# Patient Record
Sex: Male | Born: 1955 | Race: White | Hispanic: No | Marital: Married | Smoking: Former smoker
Health system: Southern US, Community
[De-identification: ages and names within clinical notes are randomized; demographics above are authoritative.]

## PROBLEM LIST (undated history)

## (undated) DIAGNOSIS — F419 Anxiety disorder, unspecified: Secondary | ICD-10-CM

## (undated) DIAGNOSIS — M722 Plantar fascial fibromatosis: Secondary | ICD-10-CM

## (undated) DIAGNOSIS — IMO0001 Reserved for inherently not codable concepts without codable children: Secondary | ICD-10-CM

## (undated) DIAGNOSIS — I2699 Other pulmonary embolism without acute cor pulmonale: Secondary | ICD-10-CM

## (undated) DIAGNOSIS — Z86718 Personal history of other venous thrombosis and embolism: Secondary | ICD-10-CM

## (undated) DIAGNOSIS — K219 Gastro-esophageal reflux disease without esophagitis: Secondary | ICD-10-CM

## (undated) DIAGNOSIS — I82409 Acute embolism and thrombosis of unspecified deep veins of unspecified lower extremity: Secondary | ICD-10-CM

## (undated) HISTORY — DX: Plantar fascial fibromatosis: M72.2

## (undated) HISTORY — DX: Acute embolism and thrombosis of unspecified deep veins of unspecified lower extremity: I82.409

## (undated) HISTORY — DX: Personal history of other venous thrombosis and embolism: Z86.718

## (undated) HISTORY — PX: EMBOLECTOMY: SHX44

## (undated) HISTORY — DX: Reserved for inherently not codable concepts without codable children: IMO0001

## (undated) HISTORY — DX: Gastro-esophageal reflux disease without esophagitis: K21.9

## (undated) HISTORY — DX: Anxiety disorder, unspecified: F41.9

## (undated) HISTORY — DX: Other pulmonary embolism without acute cor pulmonale: I26.99

---
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
## 2006-11-20 ENCOUNTER — Ambulatory Visit: Payer: Self-pay | Admitting: Unknown Physician Specialty

## 2009-05-18 ENCOUNTER — Ambulatory Visit: Payer: Self-pay | Admitting: Unknown Physician Specialty

## 2009-05-18 ENCOUNTER — Encounter: Payer: Self-pay | Admitting: Family Medicine

## 2009-11-13 ENCOUNTER — Ambulatory Visit: Payer: Self-pay | Admitting: Family Medicine

## 2013-07-27 ENCOUNTER — Ambulatory Visit (INDEPENDENT_AMBULATORY_CARE_PROVIDER_SITE_OTHER): Payer: Medicare HMO | Admitting: Podiatry

## 2013-07-27 ENCOUNTER — Other Ambulatory Visit: Payer: Self-pay | Admitting: *Deleted

## 2013-07-27 ENCOUNTER — Ambulatory Visit (INDEPENDENT_AMBULATORY_CARE_PROVIDER_SITE_OTHER): Payer: Medicare HMO

## 2013-07-27 ENCOUNTER — Encounter: Payer: Self-pay | Admitting: Podiatry

## 2013-07-27 VITALS — BP 113/81 | HR 70 | Resp 16 | Ht 73.0 in | Wt 180.0 lb

## 2013-07-27 DIAGNOSIS — Q6650 Congenital pes planus, unspecified foot: Secondary | ICD-10-CM

## 2013-07-27 DIAGNOSIS — M722 Plantar fascial fibromatosis: Secondary | ICD-10-CM

## 2013-07-27 MED ORDER — MELOXICAM 15 MG PO TABS
15.0000 mg | ORAL_TABLET | Freq: Every day | ORAL | Status: DC
Start: 2013-07-27 — End: 2013-12-14

## 2013-07-27 MED ORDER — METHYLPREDNISOLONE (PAK) 4 MG PO TABS: ORAL_TABLET | ORAL | Status: DC

## 2013-07-27 NOTE — Progress Notes (Signed)
   Subjective:    Patient ID: Kevin Carpenter, male    DOB: 30-Mar-1956, 58 y.o.   MRN: [id number]  HPI Comments: Been having pain in both feet. Left heel and right arch pain . Its been about a year , seen dr cline for the pain. He has not helped at all . He put me on mobic that didn't work switched to celebrex and that is not working . Have inserts in my shoes. Bought new shoes , put tape on my feet with some sort of sponge . Borrowed a night splint from one of dr hyatts patients. Hurts more after being on feet all day. Work on [employer] .      Review of Systems  HENT: Positive for hearing loss.        Ringing in ears  Sinus problems   Allergic/Immunologic: Positive for environmental allergies.  All other systems reviewed and are negative.      Objective:   Physical Exam: I have reviewed his past medical history medications allergies surgeries social history review of systems. Pulses remain palpable bilateral. Neurologic sensorium is intact per Semmes-Weinstein monofilament. Deep tendon reflexes are intact bilateral muscle strength is 5 over 5 dorsiflexors plantar flexors inverters everters all intrinsic musculature is intact. Orthopedic evaluation does demonstrates severe pain on palpation medial continued tubercle of the left heel. Mild tenderness on palpation along the medial longitudinal arch of the right foot. Radiographic evaluation of the bilateral foot demonstrates soft tissue increase in density at the plantar fascial calcaneal insertion site bilateral left greater than right.        Assessment & Plan:  Assessment: Is plantar fasciitis left foot greater than right.  Plan: Injected the left heel today wrote a prescription for Sterapred Dosepak to be followed by medic. He has a night splint at home and she's going utilize. We placed him in a plantar fascial brace. Discussed appropriate shoe gear stretching exercises and ice therapy. May need orthotics next visit.

## 2013-08-17 ENCOUNTER — Ambulatory Visit (INDEPENDENT_AMBULATORY_CARE_PROVIDER_SITE_OTHER): Payer: Medicare HMO | Admitting: Podiatry

## 2013-08-17 ENCOUNTER — Encounter: Payer: Self-pay | Admitting: Podiatry

## 2013-08-17 VITALS — BP 110/80 | HR 70 | Resp 16

## 2013-08-17 DIAGNOSIS — M722 Plantar fascial fibromatosis: Secondary | ICD-10-CM

## 2013-08-17 NOTE — Progress Notes (Signed)
He presents today for followup of his plantar fasciitis. He states that his right foot is approximately 95% better more than likely secondary to a pair shoes that he has recently purchased. He states that the left foot is minimally better approximately 15% but he depends on the plantar fascial brace the new shoes anti-inflammatories and a night splint. He states that he just wants this gone taking care of.  Objective: Vital signs are stable he is alert and oriented x3. Pulses are strongly palpable bilateral. Minimal pain on palpation medial continued tubercle of the right heel. Left heel still has pain on palpation and is still warm to touch along the medial band of the plantar fascia at its insertion site. The distal plantar fasciitis has resolved and the medial longitudinal arch plantar fasciitis has resolved. Strangely enough is all located at the insertional site at this point.  Assessment resolving plantar fasciitis right and left to a lesser degree.  Plan: Reinjected his left heel today with Kenalog and local anesthetic she will start back on his meloxicam and he will continue all other conservative therapies I will followup with him in approximately one month at which time we may need to consider orthotics prior to surgical intervention if necessary

## 2013-09-13 ENCOUNTER — Telehealth: Payer: Self-pay | Admitting: *Deleted

## 2013-09-13 NOTE — Telephone Encounter (Signed)
I have no Idea.

## 2013-09-13 NOTE — Telephone Encounter (Signed)
He;s going to have to start wearing steel toe shoes.  He wants a recommendation.  He needs them before his next visit on 09/28/2013.  Recommendation on what brand to purchase.

## 2013-09-14 NOTE — Telephone Encounter (Signed)
I called and informed him Dr. Milinda Pointer said he has no idea about the name of a good steel toe shoe.  I told him we have some patients that like [employer].  He stated he has worn those before and they are good shoes.  He said however his company is buying the shoes and they use a different company.  He thanked me for calling.

## 2013-09-22 ENCOUNTER — Encounter: Payer: Self-pay | Admitting: Podiatry

## 2013-09-22 ENCOUNTER — Ambulatory Visit (INDEPENDENT_AMBULATORY_CARE_PROVIDER_SITE_OTHER): Payer: Medicare HMO | Admitting: Podiatry

## 2013-09-22 DIAGNOSIS — M722 Plantar fascial fibromatosis: Secondary | ICD-10-CM

## 2013-09-22 NOTE — Progress Notes (Signed)
He presents today after purchasing a new pair of new balance tennis shoes stating that his bilateral heels and feet are feeling much better. He states that his left heel is still the worst it is most painful. His right heel however is more painful laterally.  Objective: Pulses are palpable bilateral pain on palpation medial continued tubercle left heel lateral calcaneal tubercle right heel.  Assessment: Plantar fasciitis and lateral compensatory syndrome bilateral.  Plan: Injected with Kenalog Depo-Medrol dexamethasone and local anesthetic to the bilateral heels today. He will continue all conservative therapies followup with him in 6 weeks

## 2013-10-05 ENCOUNTER — Ambulatory Visit: Payer: Medicare HMO | Admitting: Podiatry

## 2013-11-03 ENCOUNTER — Encounter: Payer: Self-pay | Admitting: Podiatry

## 2013-11-03 ENCOUNTER — Ambulatory Visit (INDEPENDENT_AMBULATORY_CARE_PROVIDER_SITE_OTHER): Payer: No Typology Code available for payment source | Admitting: Podiatry

## 2013-11-03 VITALS — BP 120/80 | HR 88 | Resp 16

## 2013-11-03 DIAGNOSIS — M722 Plantar fascial fibromatosis: Secondary | ICD-10-CM

## 2013-11-03 NOTE — Progress Notes (Signed)
He presents today for followup of his plantar fasciitis. He states it is approximately 60% improved. He continues to improve daily he states.  Objective: Vital signs are stable he is alert and oriented x3. Minimal pain on palpation medial calcaneal tubercles bilateral.  Assessment: Plantar fasciitis bilateral slowly resolving.  Plan: Continue all conservative therapies. Followup with him in September for another set of injections.

## 2013-12-01 ENCOUNTER — Encounter: Payer: Self-pay | Admitting: Podiatry

## 2013-12-01 ENCOUNTER — Ambulatory Visit (INDEPENDENT_AMBULATORY_CARE_PROVIDER_SITE_OTHER): Payer: No Typology Code available for payment source | Admitting: Podiatry

## 2013-12-01 VITALS — BP 135/84 | HR 70 | Resp 18 | Wt 183.0 lb

## 2013-12-01 DIAGNOSIS — M7752 Other enthesopathy of left foot: Secondary | ICD-10-CM

## 2013-12-01 DIAGNOSIS — M722 Plantar fascial fibromatosis: Secondary | ICD-10-CM

## 2013-12-01 DIAGNOSIS — M7750 Other enthesopathy of unspecified foot: Secondary | ICD-10-CM

## 2013-12-01 NOTE — Progress Notes (Signed)
He presents today stating the he is some better than he was previously. He still has pain to the bilateral heels and to the lateral aspect of the left foot. He states that he noticed it he is compensating which is causing his pain to the left foot.  Objective: Vital signs are stable he is alert and oriented x3 he still has pain on palpation medial continued tubercles bilateral. Overlying soft tissue mass such as a bursa fifth metatarsal base of the left foot no need for radiographs do demonstrate any type of osseous abnormalities in this area.  Assessment: Bursitis capsulitis fifth metatarsal base left foot plantar fasciitis bilateral.  Plan: Discussed etiology pathology conservative versus surgical therapies. Injected the bilateral heels today and the fifth metatarsal base of the left foot. He was also scan for some orthotics. He will continue all other conservative therapies.

## 2013-12-14 ENCOUNTER — Other Ambulatory Visit: Payer: Self-pay | Admitting: Podiatry

## 2013-12-15 ENCOUNTER — Other Ambulatory Visit: Payer: Self-pay | Admitting: Podiatry

## 2013-12-19 ENCOUNTER — Encounter: Payer: Self-pay | Admitting: *Deleted

## 2014-01-09 ENCOUNTER — Ambulatory Visit (INDEPENDENT_AMBULATORY_CARE_PROVIDER_SITE_OTHER): Payer: No Typology Code available for payment source | Admitting: Podiatry

## 2014-01-09 VITALS — BP 106/74 | HR 79 | Resp 16

## 2014-01-09 DIAGNOSIS — M722 Plantar fascial fibromatosis: Secondary | ICD-10-CM

## 2014-01-09 NOTE — Patient Instructions (Signed)

## 2014-01-09 NOTE — Progress Notes (Signed)
He presents today to pick up his orthotics. He was me to take a look at his toes third digit of the right foot where he pulled a piece of skin off and it was bleeding.  Objective: Vital signs are stable he is alert and oriented x3 superficial paronychia secondary to removing a small piece of skin tibial border third digit of the right foot. Orthotics appear to be fitting well.  Assessment: Paronychia third digit right foot. Plantar fasciitis resolving.  Plan: Soak the right foot in Epsom salts and water. Should the ingrown nail or the paronychia not resolve notify me immediately. Otherwise he'll followup with him in one month at which time he may need another injection

## 2014-01-27 ENCOUNTER — Ambulatory Visit: Payer: Self-pay | Admitting: Otolaryngology

## 2014-02-01 ENCOUNTER — Ambulatory Visit: Payer: Self-pay | Admitting: Internal Medicine

## 2014-02-01 DIAGNOSIS — R079 Chest pain, unspecified: Secondary | ICD-10-CM | POA: Insufficient documentation

## 2014-02-01 DIAGNOSIS — I82409 Acute embolism and thrombosis of unspecified deep veins of unspecified lower extremity: Secondary | ICD-10-CM | POA: Insufficient documentation

## 2014-02-01 DIAGNOSIS — R0602 Shortness of breath: Secondary | ICD-10-CM | POA: Insufficient documentation

## 2014-02-01 DIAGNOSIS — F419 Anxiety disorder, unspecified: Secondary | ICD-10-CM | POA: Insufficient documentation

## 2014-02-06 ENCOUNTER — Institutional Professional Consult (permissible substitution): Payer: Self-pay | Admitting: Internal Medicine

## 2014-02-06 ENCOUNTER — Ambulatory Visit: Payer: Self-pay | Admitting: Vascular Surgery

## 2014-02-06 ENCOUNTER — Ambulatory Visit: Payer: No Typology Code available for payment source | Admitting: Podiatry

## 2014-02-07 ENCOUNTER — Ambulatory Visit: Payer: Self-pay | Admitting: Vascular Surgery

## 2014-02-27 ENCOUNTER — Ambulatory Visit: Payer: No Typology Code available for payment source | Admitting: Podiatry

## 2014-02-28 DIAGNOSIS — I719 Aortic aneurysm of unspecified site, without rupture: Secondary | ICD-10-CM | POA: Insufficient documentation

## 2014-02-28 DIAGNOSIS — Q2543 Congenital aneurysm of aorta: Secondary | ICD-10-CM | POA: Insufficient documentation

## 2014-02-28 DIAGNOSIS — I7121 Aneurysm of the ascending aorta, without rupture: Secondary | ICD-10-CM | POA: Insufficient documentation

## 2014-04-17 ENCOUNTER — Ambulatory Visit (INDEPENDENT_AMBULATORY_CARE_PROVIDER_SITE_OTHER): Payer: No Typology Code available for payment source | Admitting: Podiatry

## 2014-04-17 ENCOUNTER — Encounter: Payer: Self-pay | Admitting: *Deleted

## 2014-04-17 DIAGNOSIS — M722 Plantar fascial fibromatosis: Secondary | ICD-10-CM

## 2014-04-17 NOTE — Progress Notes (Signed)
Called [facility] spoke with alex. Told him pt is requesting 2nd pair of orthotics same as first pair. Alex understood and will start the order.

## 2014-04-17 NOTE — Progress Notes (Signed)
He presents today for follow-up visit of his plantar fasciitis. He said that both heels are hurting.  Objective: Vital signs are stable he is alert and oriented 3. Pain on palpation medial calcaneal tubercle bilateral. Pulses are palpable and calf pain is not present.  Assessment: Pain in limb secondary to plantar fasciitis bilateral  Plan: Injected bilateral heels today continue all conservative therapies follow up with me in 1 month if necessary

## 2014-05-15 ENCOUNTER — Ambulatory Visit: Payer: No Typology Code available for payment source | Admitting: Podiatry

## 2014-05-16 ENCOUNTER — Encounter: Payer: Self-pay | Admitting: Podiatry

## 2014-05-29 ENCOUNTER — Ambulatory Visit (INDEPENDENT_AMBULATORY_CARE_PROVIDER_SITE_OTHER): Payer: No Typology Code available for payment source | Admitting: Podiatry

## 2014-05-29 ENCOUNTER — Encounter: Payer: Self-pay | Admitting: Podiatry

## 2014-05-29 DIAGNOSIS — G5790 Unspecified mononeuropathy of unspecified lower limb: Secondary | ICD-10-CM

## 2014-05-29 DIAGNOSIS — M722 Plantar fascial fibromatosis: Secondary | ICD-10-CM | POA: Diagnosis not present

## 2014-05-29 NOTE — Progress Notes (Signed)
He presents today to follow-up for his plantar for fasciitis. He states that is really not a lot better.  Objective: Vital signs are stable he is alert and oriented 3. Pain on palpation medial calcaneal tubercles bilateral.  Assessment: Secondary to the fact that we have been treating this approximate 10 months at this point I think we should readdress our strategy and consider a neuritis.  Plan: We will send back his orthotics and have them recovered as they are falling apart him September of this year. I also think we should start dehydrated alcohol injections bilaterally. He received his first dehydrated alcohol injection today and will receive his second in 3 weeks

## 2014-06-21 ENCOUNTER — Encounter: Payer: Self-pay | Admitting: Podiatry

## 2014-06-21 ENCOUNTER — Ambulatory Visit (INDEPENDENT_AMBULATORY_CARE_PROVIDER_SITE_OTHER): Payer: No Typology Code available for payment source | Admitting: Podiatry

## 2014-06-21 DIAGNOSIS — G5790 Unspecified mononeuropathy of unspecified lower limb: Secondary | ICD-10-CM

## 2014-06-21 DIAGNOSIS — M722 Plantar fascial fibromatosis: Secondary | ICD-10-CM | POA: Diagnosis not present

## 2014-06-21 NOTE — Patient Instructions (Signed)

## 2014-06-22 NOTE — Progress Notes (Signed)
He presents today to pick up his orthotics and he states that his heel seems to be worse now than they have been before.  Objective: Vital signs are stable he is alert and oriented 3. Pulses are palpable. He has pain on palpation medially calcaneal tubercles bilateral heel I reinjected his heels today with dehydrated alcohol. This is his second dose of dehydrated alcohol.  Assessment: Neuritis fasciitis bilateral.  Plan: Reinjected his second dose of dehydrated alcohol today to each heel. He requested narcotics and I declined. He will try his newest orthotics and I will follow-up with him in 3 weeks.

## 2014-07-12 ENCOUNTER — Ambulatory Visit (INDEPENDENT_AMBULATORY_CARE_PROVIDER_SITE_OTHER): Payer: No Typology Code available for payment source | Admitting: Podiatry

## 2014-07-12 VITALS — BP 114/75 | HR 74 | Resp 16

## 2014-07-12 DIAGNOSIS — G5790 Unspecified mononeuropathy of unspecified lower limb: Secondary | ICD-10-CM | POA: Diagnosis not present

## 2014-07-12 DIAGNOSIS — M722 Plantar fascial fibromatosis: Secondary | ICD-10-CM

## 2014-07-12 NOTE — Progress Notes (Signed)
He presents today for follow-up of his plantar fasciitis neuritis of the bilateral heels. He states they may be a little bit better.  Objective: Vital signs are stable he is alert and oriented 3. Pulses are strongly palpable bilateral. He still has pain on palpation medial calcaneal tubercles bilateral. No Pain.  Assessment: Plantar fasciitis/Baxter's neuritis bilateral.  Plan: Discussed etiology pathology conservative versus surgical therapies we injected his third dose of dehydrated alcohol today I will follow-up with him in 3 weeks

## 2014-08-02 ENCOUNTER — Ambulatory Visit (INDEPENDENT_AMBULATORY_CARE_PROVIDER_SITE_OTHER): Payer: No Typology Code available for payment source | Admitting: Podiatry

## 2014-08-02 ENCOUNTER — Encounter: Payer: Self-pay | Admitting: Podiatry

## 2014-08-02 VITALS — Ht 73.0 in | Wt 185.0 lb

## 2014-08-02 DIAGNOSIS — G5790 Unspecified mononeuropathy of unspecified lower limb: Secondary | ICD-10-CM

## 2014-08-02 NOTE — Progress Notes (Signed)
He presents today for follow-up of his neuritis bilateral foot. He states that he Is having better days then previously noted. A few were bad days. He states that he still has pain but it seems to be a less degree. His left foot feels much better than the right.  Objective: Vital signs are stable he is alert and oriented 3 still has tenderness on palpation medial calcaneal tubercle bilateral right greater than left. Pulses remain palpable pain.  Assessment: Neuritis plantar fasciitis bilateral.  Plan: Injected the bilateral heels with dehydrated alcohol once again today alleviating his symptomatology immediately. I will follow-up with him in 3 weeks.

## 2014-08-23 ENCOUNTER — Ambulatory Visit (INDEPENDENT_AMBULATORY_CARE_PROVIDER_SITE_OTHER): Payer: No Typology Code available for payment source | Admitting: Podiatry

## 2014-08-23 ENCOUNTER — Encounter: Payer: Self-pay | Admitting: Podiatry

## 2014-08-23 VITALS — BP 100/67 | HR 72 | Resp 16

## 2014-08-23 DIAGNOSIS — G5790 Unspecified mononeuropathy of unspecified lower limb: Secondary | ICD-10-CM

## 2014-08-23 DIAGNOSIS — M722 Plantar fascial fibromatosis: Secondary | ICD-10-CM

## 2014-08-23 NOTE — Progress Notes (Signed)
Kevin Carpenter presents today for follow-up of his bilateral heel pain he states that it is been bothering him worse over the past 3 weeks that it has an long time. He's ready to have something done to these as necessary. He also has a history of lower back pain sees a chiropractor on a regular basis.  Objective: Vital signs are stable he is alert and oriented 3. Pulses are palpable bilateral. Severe pain on palpation medial calcaneal tubercles bilateral.  Assessment: Chronic intractable plantar fasciitis/neuritis. Do not rule out a radiculopathy bilateral.  Plan: Chemical destruction actors nerve bilateral heels this was his seventh dehydrated alcohol injection. At this point due to the chronicity of the pain and if intractable state requesting that he see Dr. Mohammed Kindle for evaluation of his spine. I would like to make sure that he has no spinal problems that would result in pain to his bilateral foot. Should Dr. Primus Bravo findings no calls of distal symptoms from the spine and an MRI of the bilateral foot will be necessary to confirm chronic intractable plantar fasciitis bilateral. I reinjected his heels today and will follow-up with him in 3 weeks.

## 2014-08-29 NOTE — Addendum Note (Signed)
Addended by: Rip Harbour on: 08/29/2014 08:42 AM   Modules accepted: Orders, Medications

## 2014-09-06 ENCOUNTER — Telehealth: Payer: Self-pay | Admitting: Podiatry

## 2014-09-07 NOTE — Telephone Encounter (Signed)
Made in error

## 2014-09-12 ENCOUNTER — Telehealth: Payer: Self-pay | Admitting: *Deleted

## 2014-09-12 ENCOUNTER — Telehealth: Payer: Self-pay | Admitting: Pain Medicine

## 2014-09-12 NOTE — Telephone Encounter (Signed)
Called pt to sch apt with Dr Primus Bravo, left pt vm, pt papers in crisp vm folder

## 2014-09-12 NOTE — Telephone Encounter (Signed)
Spoke with Caryl Pina at dr crisp office regarding referral from dr Milinda Pointer for Kevin Carpenter, she stated that she had just got the approval from dr crisp to see the patient , she is calling the patient today to schedule the appointment

## 2014-09-13 ENCOUNTER — Ambulatory Visit: Payer: No Typology Code available for payment source | Admitting: Podiatry

## 2014-09-18 ENCOUNTER — Ambulatory Visit: Payer: No Typology Code available for payment source | Admitting: Podiatry

## 2014-09-25 ENCOUNTER — Ambulatory Visit (INDEPENDENT_AMBULATORY_CARE_PROVIDER_SITE_OTHER): Payer: No Typology Code available for payment source | Admitting: Podiatry

## 2014-09-25 DIAGNOSIS — G5790 Unspecified mononeuropathy of unspecified lower limb: Secondary | ICD-10-CM

## 2014-09-25 DIAGNOSIS — M722 Plantar fascial fibromatosis: Secondary | ICD-10-CM | POA: Diagnosis not present

## 2014-09-25 NOTE — Progress Notes (Signed)
He presents today for follow-up of his injections of the plantar heel bilaterally. He states I see no improvement. He states that he goes tomorrow to see Dr. Josefa Half for the pain in his feet. He states that his chiropractor has adjusted several times and alleviated some of the tenderness in his feet with ultrasound. Otherwise the only time he has gotten any better is when he took a sterilely dose pack.  Objective: Pulses are palpable bilateral pain. Has pain on palpation medial calcaneal tubercle bilateral.  Assessment: Chronic intractable plantar fasciitis neuritis can't rule out radiculopathy and chronic pain bilateral.  Plan: Should his back not be the issue for his chronic foot pain I would like to consider MRI with contrast for an attempt to rule out tarsal tunnel syndrome and plantar fasciitis.

## 2014-09-26 ENCOUNTER — Encounter: Payer: Self-pay | Admitting: Pain Medicine

## 2014-09-26 ENCOUNTER — Ambulatory Visit: Payer: No Typology Code available for payment source | Attending: Pain Medicine | Admitting: Pain Medicine

## 2014-09-26 VITALS — BP 115/74 | HR 64 | Temp 97.8°F | Resp 16 | Ht 73.0 in | Wt 185.0 lb

## 2014-09-26 DIAGNOSIS — M79672 Pain in left foot: Secondary | ICD-10-CM | POA: Diagnosis present

## 2014-09-26 DIAGNOSIS — G90523 Complex regional pain syndrome I of lower limb, bilateral: Secondary | ICD-10-CM | POA: Diagnosis not present

## 2014-09-26 DIAGNOSIS — M79671 Pain in right foot: Secondary | ICD-10-CM | POA: Diagnosis present

## 2014-09-26 DIAGNOSIS — M5136 Other intervertebral disc degeneration, lumbar region: Secondary | ICD-10-CM | POA: Diagnosis not present

## 2014-09-26 DIAGNOSIS — M722 Plantar fascial fibromatosis: Secondary | ICD-10-CM | POA: Insufficient documentation

## 2014-09-26 MED ORDER — GABAPENTIN 300 MG PO CAPS: ORAL_CAPSULE | ORAL | Status: DC

## 2014-09-26 NOTE — Progress Notes (Signed)
   Subjective:    Patient ID: Kevin Carpenter, male    DOB: Sep 25, 1955, 59 y.o.   MRN: [id number]  HPI  Patient is 59 year old gentleman who comes to pain management Center at the request of Dr. Sherren Mocha height for further evaluation and treatment of pain involving the feet. Patient is with burning sensation of the feet which is constant agonizing and more intense End of the day patient states that the pain increases with standing the pain decreased with lying down and medications. The patient has diagnosis of plantar fasciitis of both feet. Patient's pain has persisted despite prior treatment. We discussed patient's condition on today's visit and will consider modification of medications at this time. There is concern regarding component of pain being due to complex regional pain syndrome of the lower extremities. Patient will continue his his anticoagulant therapy and we will avoid interventional treatment at this time. We will begin Neurontin and pending response to Neurontin May consider additional modification of treatment regimen we have also discussed beginning Cymbalta which we may consider as well as of the medications. The patient was understanding and in agreement with suggested treatment plan    Review of Systems  Cardiovascular Anticoagulant therapy  Pulmonary Unremarkable  Neurological Unremarkable  Psychological Unremarkable  Gastrointestinal Unremarkable  Genitourinary Unremarkable  Hematological Anticoagulant therapy  Endocrine Unremarkable  Rheumatological Unremarkable  Musculoskeletal Unremarkable  Other significant histoy Unremarkable         Objective:   Physical Exam  There was tennis over the splenius capitis occipitalis regions palpation of which reproduced minimal discomfort. There appeared to be unremarkable Spurling's maneuver and patient appeared to be with bilaterally equal grip strength. Tinel's and Phalen's maneuver were without increase of  pain of any significant degree. There was no crepitus of the thoracic region noted. Palpation over the lumbar paraspinal musculature region lumbar facet region was with minimal discomfort. Lateral bending and rotation and extension and palpation of the lumbar facet region reproduced minimal discomfort. There was minimal discomfort of the PSIS and PII S regions. No definite sensory deficit of dermatomal distribution of the lower extremities was detected. There was tenderness to palpation of the feet in the region of the calcaneus with no evidence of new lesions of the feet. There was some increased sensitivity to touch in the region of the heels with questionable decreased temperature of the lower extremities. There was minimal tenderness of the greater trochanteric region iliotibial band region. Abdomen was nontender and no costovertebral angle tenderness was noted.      Assessment & Plan:   Complex regional pain syndrome of the lower extremities  Plantar fasciitis, bilateral  Degenerative disc disease of the lumbar spine There is concern regarding intraspinal abnormalities contributing to patient's symptomatology we will consider further is pending response to the present treatment    Plan   Continue present medications. As discussed with the patient, we began Neurontin today  We will avoid interventional treatment. At this time patient will continue anticoagulant therapy  F/U PCP for evaliation of  BP and general medical  condition.  F/U surgical evaluation.  F/U neurological evaluation.  May consider radiofrequency rhizolysis or intraspinal procedures pending response to present treatment and F/U evaluation.  Patient to call Pain Management Center should patient have concerns prior to scheduled return appointment.

## 2014-09-26 NOTE — Progress Notes (Signed)
Safety precautions to be maintained throughout the outpatient stay will include: orient to surroundings, keep bed in low position, maintain call bell within reach at all times, provide assistance with transfer out of bed and ambulation.  

## 2014-09-26 NOTE — Patient Instructions (Addendum)
Continue present medications and begin Neurontin as prescribed. We may consider Cymbalta as well as discussed  F/U PCP for evaliation of  BP and general medical  condition.  F/U surgical evaluation follow-up Dr. Milinda Pointer as discussed  F/U neurological evaluation.  May consider radiofrequency rhizolysis or intraspinal procedures pending response to present treatment and F/U evaluation.  Patient to call Pain Management Center should patient have concerns prior to scheduled return appointment.

## 2014-09-27 ENCOUNTER — Telehealth: Payer: Self-pay | Admitting: Podiatry

## 2014-09-27 NOTE — Telephone Encounter (Signed)
Spoke with patient's wife regarding treatment at the pain center. I did advise that he should continue treatment at the pain center seeing that he has only had one initial evaluation. I informed the wife that he only needs to see Dr. Milinda Pointer if he wants to pursue surgical options for the plantar fasciitis. Otherwise, he is to continue with Dr. Ethel Rana treatment plan.

## 2014-09-27 NOTE — Telephone Encounter (Signed)
Patient's wife called to schedule an appointment since husband was since yesterday at the pain clinic. He is scheduled to come in at 3:45pm on Wednesday July 27th. The wife has questions and concerns since the appointment with the pain clinic yesterday. She is asking to be called to see what the next step is.

## 2014-10-06 ENCOUNTER — Other Ambulatory Visit: Payer: Self-pay | Admitting: Pain Medicine

## 2014-10-18 ENCOUNTER — Other Ambulatory Visit: Payer: Self-pay | Admitting: Pain Medicine

## 2014-10-18 DIAGNOSIS — C4491 Basal cell carcinoma of skin, unspecified: Secondary | ICD-10-CM

## 2014-10-18 HISTORY — DX: Basal cell carcinoma of skin, unspecified: C44.91

## 2014-10-23 ENCOUNTER — Encounter: Payer: Self-pay | Admitting: Pain Medicine

## 2014-10-23 ENCOUNTER — Ambulatory Visit: Payer: No Typology Code available for payment source | Attending: Pain Medicine | Admitting: Pain Medicine

## 2014-10-23 VITALS — BP 124/75 | HR 50 | Temp 97.8°F | Resp 16 | Ht 73.0 in | Wt 185.0 lb

## 2014-10-23 DIAGNOSIS — G90523 Complex regional pain syndrome I of lower limb, bilateral: Secondary | ICD-10-CM

## 2014-10-23 DIAGNOSIS — G90529 Complex regional pain syndrome I of unspecified lower limb: Secondary | ICD-10-CM | POA: Diagnosis not present

## 2014-10-23 DIAGNOSIS — M79605 Pain in left leg: Secondary | ICD-10-CM | POA: Diagnosis present

## 2014-10-23 DIAGNOSIS — M722 Plantar fascial fibromatosis: Secondary | ICD-10-CM | POA: Diagnosis not present

## 2014-10-23 DIAGNOSIS — G588 Other specified mononeuropathies: Secondary | ICD-10-CM | POA: Insufficient documentation

## 2014-10-23 DIAGNOSIS — M79604 Pain in right leg: Secondary | ICD-10-CM | POA: Diagnosis present

## 2014-10-23 MED ORDER — GABAPENTIN 300 MG PO CAPS: ORAL_CAPSULE | ORAL | Status: DC

## 2014-10-23 NOTE — Patient Instructions (Addendum)
Continue present medication Neurontin and take 1 capsule at bedtime and one in the morning and 1 during the day. May increase to 2 capsules at bedtime then 1 in the morning and 1 during the day after one week if tolerated  F/U PCP Dr Margit Banda  for evaliation of  BP and general medical  condition.  F/U surgical evaluation Dr Milinda Pointer and discussed your work shoes including inserts which Dr.Hyatt may recommend   F/U neurological evaluation  May consider radiofrequency rhizolysis or intraspinal procedures pending response to present treatment and F/U evaluation.  Patient to call Pain Management Center should patient have concerns prior to scheduled return appointment.

## 2014-10-23 NOTE — Progress Notes (Signed)
   Subjective:    Patient ID: Kevin Carpenter, male    DOB: 05/16/1955, 59 y.o.   MRN: [id number]  HPI Patient is 59 year old gentleman returns a Pain Management Center for further evaluation and treatment of pain involving the lower extremity regions. Patient is with diagnosis of plantar fasciitis and is with severe pain involving the feet with burning stinging throbbing and aching sensation. Patient notes some improvement of his pain with Neurontin which we prescribed at time of patient's previous visit Pain Management Center. We will also discussed medications Cymbalta which we will avoid prescribing at this time and we will increase Neurontin dose instead we will continue to avoid interventional treatment due to patient's anticoagulant therapy. The patient was understanding and in agreement status treatment plan. We'll also recommended patient follow-up with Dr. Milinda Pointer to discuss adjustment of orthotics for patient's work shoes and for other issues. The patient was understanding and in agreement with suggested treatment plan     Review of Systems     Objective:   Physical Exam There was mild tenderness of the splenius capitis and occipitalis musculature region. There was mild tenderness of the cervical facet cervical paraspinal musculature region mild tenderness of the thoracic facet thoracic paraspinal musculature region. The patient was with tenderness to palpation of the acromioclavicular glenohumeral joint region a minimal degree. Patient appeared to be with bilaterally equal grip strength. Tinel and Phalen's maneuver were without increase of pain significant degree. There was no crepitus of the thoracic region noted. Palpation over the lumbar paraspinal muscles region lumbar facet region associated with mild discomfort. Lateral bending and rotation and extension and palpation of the lumbar facets reproduce mild discomfort. Mild tenderness of the greater trochanteric region iliotibial band region.  Straight leg raising was tolerates approximately 30 without an increase of pain with dorsiflexion noted. EHL strength appeared to be decreased. No definite sensory deficit of dermatomal distribution detected. The feet were with tenderness to palpation plantar aspect of the feet and in the region of the calcaneus. No new lesions of the feet were noted. There was increased tenderness to palpation of the feet without definite allodynia noted. Abdomen nontender with no costovertebral angle tenderness noted.       Assessment & Plan:      Plantar fasciitis  Complex regional pain syndrome of the lower extremities  Neuralgia of feet   Plan   Continue present medication Neurontin. Neurontin dose was increased to one Neurontin 3 times per day for one week then increase to 2 Neurontin at bedtime and one in the morning and 1 mid day if tolerated  F/U PCP Dr.Chabon   for evaliation of  BP and general medical  condition.  F/U surgical evaluation with Dr.Hyatt to discuss orthotics for work boots and other issues   F/U neurological evaluation  May consider radiofrequency rhizolysis or intraspinal procedures pending response to present treatment and F/U evaluation.  Patient to call Pain Management Center should patient have concerns prior to scheduled return appointment.

## 2014-10-23 NOTE — Progress Notes (Signed)
Safety precautions to be maintained throughout the outpatient stay will include: orient to surroundings, keep bed in low position, maintain call bell within reach at all times, provide assistance with transfer out of bed and ambulation.  

## 2014-10-25 ENCOUNTER — Ambulatory Visit: Payer: No Typology Code available for payment source | Admitting: Podiatry

## 2014-10-30 DIAGNOSIS — I2699 Other pulmonary embolism without acute cor pulmonale: Secondary | ICD-10-CM | POA: Insufficient documentation

## 2014-11-14 ENCOUNTER — Other Ambulatory Visit: Payer: Self-pay | Admitting: Pain Medicine

## 2014-11-23 ENCOUNTER — Encounter: Payer: Self-pay | Admitting: Pain Medicine

## 2014-11-23 ENCOUNTER — Ambulatory Visit: Payer: No Typology Code available for payment source | Attending: Pain Medicine | Admitting: Pain Medicine

## 2014-11-23 VITALS — BP 107/80 | HR 61 | Temp 97.7°F | Resp 18 | Ht 73.0 in | Wt 185.0 lb

## 2014-11-23 DIAGNOSIS — G90523 Complex regional pain syndrome I of lower limb, bilateral: Secondary | ICD-10-CM

## 2014-11-23 DIAGNOSIS — M79605 Pain in left leg: Secondary | ICD-10-CM | POA: Diagnosis present

## 2014-11-23 DIAGNOSIS — G588 Other specified mononeuropathies: Secondary | ICD-10-CM | POA: Diagnosis not present

## 2014-11-23 DIAGNOSIS — M722 Plantar fascial fibromatosis: Secondary | ICD-10-CM

## 2014-11-23 DIAGNOSIS — M79604 Pain in right leg: Secondary | ICD-10-CM | POA: Diagnosis present

## 2014-11-23 MED ORDER — GABAPENTIN 300 MG PO CAPS: ORAL_CAPSULE | ORAL | Status: DC

## 2014-11-23 NOTE — Progress Notes (Signed)
Discharged to home ambulatory with script in hand for gabapentin.  Return in 1 month.

## 2014-11-23 NOTE — Progress Notes (Signed)
   Subjective:    Patient ID: Kevin Carpenter, male    DOB: [birth year], 59 y.o.   MRN: [id number]  HPI  Patient is 59 year old gentleman [facility] for further evaluation and treatment of pain involving the lower extremity regions. Patient states he is doing much better at this time. We adjusted patient's Neurontin and patient appears to be improving in terms of the pain involving his feet. We also discussed patient shoes and patient will dress this with Dr. Milinda Pointer and will consider undergoing evaluation of his feet at shoe store as we discussed. Patient without any trauma or other changes in condition. We discussed interventional treatment which we will avoid at this time. We addressed the need for patient to wear proper shoes to avoid aggravation of his condition. The patient was understanding and agreed suggested treatment plan.    Review of Systems     Objective:   Physical Exam  There was minimal tenderness of the splenius capitis and occipitalis musculature region. Patient appeared to be with bilaterally equal grip strength. Tinel and Phalen's maneuver without increased pain of any significant degree. There was minimal tenderness of the splenius capitis and occipitalis musculature regions. There was unremarkable Spurling's maneuver. No tenderness of the acromioclavicular glenohumeral joint region was noted. There was no crepitus of the thoracic region noted. Palpation over the lumbar paraspinal musculature region lumbar facet region was a tends to palpation of mild degree. With mild tenderness of the PSIS and PII S regions as well as the gluteal and piriformis musculature regions. There was no significant tends to palpation of the greater trochanteric region iliotibial band region. Straight leg raising was tolerates approximately 30 without increased pain with dorsiflexion noted. There was negative clonus negative Homans. There was no tends to palpation of the PSIS and PII S  region of the gluteal and piriformis musculature region. There was minimal tends to palpation greater trochanteric region and iliotibial band region. There was negative clonus negative Homans. Abdomen was soft nontender and no costovertebral angle tenderness was noted.    Assessment & Plan:    Plantar fasciitis  Complex regional pain syndrome of the lower extremities  Neuralgia of feet    Plan   Continue present medication Neurontin as discussed  F/U PCP Dr.Chabon   for evaliation of  BP and general medical  condition  F/U surgical evaluationArea patient will follow-up with Dr. Milinda Pointer as discussed  F/U neurological evaluation  May consider radiofrequency rhizolysis or intraspinal procedures pending response to present treatment and F/U evaluation   Patient to call [facility] Management Center should patient have concerns prior to scheduled return appointmen.

## 2014-11-23 NOTE — Patient Instructions (Addendum)
Continue present medication Neurontin and take as we discussed and avoid drowsiness confusion swelling and other side effects as we discussed  F/U PCP Dr. Margit Banda  for evaliation of  BP and general medical  condition  F/U surgical evaluation  F/U Dr.Hyatt and visit shoe  store for advice regarding proper fitting of shoes if Dr. Milinda Pointer agrees with this  F/U neurological evaluation  May consider radiofrequency rhizolysis or intraspinal procedures pending response to present treatment and F/U evaluation   Patient to call [facility] Management Center should patient have concerns prior to scheduled return appointmen.

## 2014-11-23 NOTE — Progress Notes (Signed)
Safety precautions to be maintained throughout the outpatient stay will include: orient to surroundings, keep bed in low position, maintain call bell within reach at all times, provide assistance with transfer out of bed and ambulation.  

## 2014-12-18 ENCOUNTER — Other Ambulatory Visit: Payer: Self-pay | Admitting: *Deleted

## 2014-12-18 ENCOUNTER — Telehealth: Payer: Self-pay | Admitting: *Deleted

## 2014-12-18 DIAGNOSIS — G5790 Unspecified mononeuropathy of unspecified lower limb: Secondary | ICD-10-CM

## 2014-12-18 DIAGNOSIS — M722 Plantar fascial fibromatosis: Secondary | ICD-10-CM

## 2014-12-18 NOTE — Telephone Encounter (Signed)
Dr. Milinda Pointer referred pt to Dr. Jannifer Franklin at [facility]. Faxed pt data, insurance cards and chart notes.

## 2014-12-22 ENCOUNTER — Ambulatory Visit (INDEPENDENT_AMBULATORY_CARE_PROVIDER_SITE_OTHER): Payer: No Typology Code available for payment source | Admitting: Neurology

## 2014-12-22 ENCOUNTER — Encounter: Payer: Self-pay | Admitting: Neurology

## 2014-12-22 VITALS — BP 106/72 | HR 63 | Ht 73.0 in | Wt 188.0 lb

## 2014-12-22 DIAGNOSIS — M722 Plantar fascial fibromatosis: Secondary | ICD-10-CM

## 2014-12-22 DIAGNOSIS — M25579 Pain in unspecified ankle and joints of unspecified foot: Secondary | ICD-10-CM | POA: Diagnosis not present

## 2014-12-22 NOTE — Patient Instructions (Addendum)
  We will set up EMG and NCV evaluation to look at the nerve function of the legs.  Musculoskeletal Pain Musculoskeletal pain is muscle and boney aches and pains. These pains can occur in any part of the body. Your caregiver may treat you without knowing the cause of the pain. They may treat you if blood or urine tests, X-rays, and other tests were normal.  CAUSES There is often not a definite cause or reason for these pains. These pains may be caused by a type of germ (virus). The discomfort may also come from overuse. Overuse includes working out too hard when your body is not fit. Boney aches also come from weather changes. Bone is sensitive to atmospheric pressure changes. HOME CARE INSTRUCTIONS   Ask when your test results will be ready. Make sure you get your test results.  Only take over-the-counter or prescription medicines for pain, discomfort, or fever as directed by your caregiver. If you were given medications for your condition, do not drive, operate machinery or power tools, or sign legal documents for 24 hours. Do not drink alcohol. Do not take sleeping pills or other medications that may interfere with treatment.  Continue all activities unless the activities cause more pain. When the pain lessens, slowly resume normal activities. Gradually increase the intensity and duration of the activities or exercise.  During periods of severe pain, bed rest may be helpful. Lay or sit in any position that is comfortable.  Putting ice on the injured area.  Put ice in a bag.  Place a towel between your skin and the bag.  Leave the ice on for 15 to 20 minutes, 3 to 4 times a day.  Follow up with your caregiver for continued problems and no reason can be found for the pain. If the pain becomes worse or does not go away, it may be necessary to repeat tests or do additional testing. Your caregiver may need to look further for a possible cause. SEEK IMMEDIATE MEDICAL CARE IF:  You have pain  that is getting worse and is not relieved by medications.  You develop chest pain that is associated with shortness or breath, sweating, feeling sick to your stomach (nauseous), or throw up (vomit).  Your pain becomes localized to the abdomen.  You develop any new symptoms that seem different or that concern you. MAKE SURE YOU:   Understand these instructions.  Will watch your condition.  Will get help right away if you are not doing well or get worse. Document Released: 03/17/2005 Document Revised: 06/09/2011 Document Reviewed: 11/19/2012 [facility] Patient Information 2015 [employer], [place]. This information is not intended to replace advice given to you by your health care provider. Make sure you discuss any questions you have with your health care provider.

## 2014-12-22 NOTE — Progress Notes (Signed)
Reason for visit:  Foot pain  Referring physician:  Dr. Trey Paula Kevin Carpenter is a 59 y.o. male  History of present illness:   Mr. Kevin Carpenter is a 59 year old right-handed white male with a history of bilateral foot pain that dates back at least 5 years. The pain has been physically severe over the last 2 years, he was felt to have plantar fasciitis and he has been treated with multiple injections by Dr. Milinda Pointer with some benefit. He has recently been sent to Dr. Primus Bravo for pain management, and he has been placed on gabapentin with some benefit. He is on about 1800 mg of gabapentin daily , he has noted some mild gait instability, with no falls. He takes occasional Tylenol for pain. He indicates that the pain is mainly on the heels, right equal to left. He does not have pain at night, he will wake up in the morning with no pain in the feet, but as soon as he stands up, the pain in the feet will begin, and worsen as the day goes on. He will walk 4 or 5 miles a day at work , and this exacerbates his foot pain. The patient does have neck and low back pain, he denies any pain radiation down from the back into the legs. The patient denies any difficulty controlling the bowels or the bladder, he denies any weakness or numbness of the extremities. He is seeing a chiropractor on a regular basis. He is sent to this office for an evaluation.  Past Medical History  Diagnosis Date  . Hx of blood clots   . Reflux   . DVT (deep venous thrombosis)   . Pulmonary embolism   . Plantar fasciitis, bilateral     Past Surgical History  Procedure Laterality Date  . Embolectomy    . Tonsillectomy      Family History  Problem Relation Age of Onset  . Heart disease Mother   . Hearing loss Father   . Heart Problems Maternal Uncle     Social history:  reports that he quit smoking about 28 years ago. His smoking use included Cigarettes. He has a 16 pack-year smoking history. His smokeless tobacco use includes Chew. He  reports that he does not drink alcohol or use illicit drugs.  Medications:  Prior to Admission medications   Medication Sig Start Date End Date Taking? Authorizing Provider  diazepam (VALIUM) 5 MG tablet Take by mouth. prn   Yes Historical Provider, MD  folic acid (FOLVITE) 1 MG tablet Take 1 mg by mouth daily.   Yes Historical Provider, MD  gabapentin (NEURONTIN) 300 MG capsule Limit  4-6 capsules by mouth per day if tolerated 11/23/14  Yes Mohammed Kindle, MD  Glucosamine-Chondroitin 250-200 MG TABS Take by mouth daily.    Yes Historical Provider, MD  Misc Natural Products (OSTEO BI-FLEX ADV DOUBLE ST PO) Take by mouth.   Yes Historical Provider, MD  Multiple Vitamin (MULTI-VITAMINS) TABS Take by mouth daily.    Yes Historical Provider, MD  Omega-3 Fatty Acids (FISH OIL) 1200 MG CAPS Take by mouth.   Yes Historical Provider, MD  omeprazole (PRILOSEC) 40 MG capsule daily. prn 07/07/13  Yes Historical Provider, MD  rivaroxaban (XARELTO) 20 MG TABS tablet TAKE 1 TABLET BY MOUTH EVERY DAY 07/18/14  Yes Historical Provider, MD  fexofenadine (ALLEGRA) 180 MG tablet Take 180 mg by mouth daily.    Historical Provider, MD     No Known Allergies  ROS:  Out of a complete 14 system review of symptoms, the patient complains only of the following symptoms, and all other reviewed systems are negative.   Hearing loss  Foot pain  Blood pressure 106/72, pulse 63, height 6\' 1"  (1.854 m), weight 188 lb (85.276 kg).  Physical Exam  General: The patient is alert and cooperative at the time of the examination.  Eyes: Pupils are equal, round, and reactive to light. Discs are flat bilaterally.  Neck: The neck is supple, no carotid bruits are noted.  Respiratory: The respiratory examination is clear.  Cardiovascular: The cardiovascular examination reveals a regular rate and rhythm, no obvious murmurs or rubs are noted.  Neuromuscular: Range of movement of the low back is full.  Skin: Extremities are  without significant edema.  Neurologic Exam  Mental status: The patient is alert and oriented x 3 at the time of the examination. The patient has apparent normal recent and remote memory, with an apparently normal attention span and concentration ability.  Cranial nerves: Facial symmetry is present. There is good sensation of the face to pinprick and soft touch bilaterally. The strength of the facial muscles and the muscles to head turning and shoulder shrug are normal bilaterally. Speech is well enunciated, no aphasia or dysarthria is noted. Extraocular movements are full. Visual fields are full. The tongue is midline, and the patient has symmetric elevation of the soft palate. No obvious hearing deficits are noted.  Motor: The motor testing reveals 5 over 5 strength of all 4 extremities. Good symmetric motor tone is noted throughout.  Sensory: Sensory testing is intact to pinprick, soft touch, vibration sensation, and position sense on all 4 extremities. No evidence of extinction is noted.  Coordination: Cerebellar testing reveals good finger-nose-finger and heel-to-shin bilaterally.  Gait and station: Gait is normal. Tandem gait is normal. Romberg is negative. No drift is seen. The patient is able to walk on the heels and toes.  Reflexes: Deep tendon reflexes are symmetric and normal bilaterally , with exception that the ankle jerk reflexes are depressed. Toes are downgoing bilaterally.   Assessment/Plan:   1. Bilateral foot pain   The patient appears to have pain with weightbearing, no pain when he is off of his feet. This is most consistent with a mechanical source pain. There is no clear evidence of a peripheral neuropathy, but the patient will undergo further workup. He will have nerve conduction studies of both legs and EMG evaluation of both legs. We will include the medial and lateral plantar sensory latencies in the study. The patient will follow-up for the EMG.  Jill Alexanders  MD 12/22/2014 7:04 PM  [facility] [address] [place],  [address]  Phone [phone] Fax [phone]

## 2014-12-26 ENCOUNTER — Encounter: Payer: Self-pay | Admitting: Pain Medicine

## 2014-12-26 ENCOUNTER — Ambulatory Visit: Payer: No Typology Code available for payment source | Attending: Pain Medicine | Admitting: Pain Medicine

## 2014-12-26 VITALS — BP 109/70 | HR 65 | Temp 96.4°F | Resp 18 | Ht 73.0 in | Wt 185.0 lb

## 2014-12-26 DIAGNOSIS — M79604 Pain in right leg: Secondary | ICD-10-CM | POA: Diagnosis present

## 2014-12-26 DIAGNOSIS — M722 Plantar fascial fibromatosis: Secondary | ICD-10-CM | POA: Insufficient documentation

## 2014-12-26 DIAGNOSIS — G588 Other specified mononeuropathies: Secondary | ICD-10-CM | POA: Insufficient documentation

## 2014-12-26 DIAGNOSIS — G90523 Complex regional pain syndrome I of lower limb, bilateral: Secondary | ICD-10-CM | POA: Insufficient documentation

## 2014-12-26 DIAGNOSIS — M79605 Pain in left leg: Secondary | ICD-10-CM | POA: Diagnosis present

## 2014-12-26 MED ORDER — GABAPENTIN 300 MG PO CAPS: ORAL_CAPSULE | ORAL | Status: DC

## 2014-12-26 MED ORDER — TRAMADOL HCL 50 MG PO TABS: ORAL_TABLET | ORAL | Status: DC

## 2014-12-26 NOTE — Progress Notes (Signed)
Safety precautions to be maintained throughout the outpatient stay will include: orient to surroundings, keep bed in low position, maintain call bell within reach at all times, provide assistance with transfer out of bed and ambulation.  

## 2014-12-26 NOTE — Patient Instructions (Addendum)
PLAN   Continue present medication Neurontin and begin Ultram. Caution Ultram can cause respiratory depression excessive sedation confusion and other side effects  F/U PCP Dr.Chabon  for evaliation of  BP and general medical  condition  F/U surgical evaluation. May consider pending follow-up evaluations  F/U neurological evaluation. May consider pending follow-up evaluations  F/U with podiatrist as discussed  May consider radiofrequency rhizolysis or intraspinal procedures pending response to present treatment and F/U evaluation   Patient to call [facility] Management Center should patient have concerns prior to scheduled return appointment. Tramadol tablets What is this medicine? TRAMADOL (TRA ma dole) is a pain reliever. It is used to treat moderate to severe pain in adults. This medicine may be used for other purposes; ask your health care provider or pharmacist if you have questions. COMMON BRAND NAME(S): Ultram What should I tell my health care provider before I take this medicine? They need to know if you have any of these conditions: -brain tumor -depression -drug abuse or addiction -head injury -if you frequently drink alcohol containing drinks -kidney disease or trouble passing urine -liver disease -lung disease, asthma, or breathing problems -seizures or epilepsy -suicidal thoughts, plans, or attempt; a previous suicide attempt by you or a family member -an unusual or allergic reaction to tramadol, codeine, other medicines, foods, dyes, or preservatives -pregnant or trying to get pregnant -breast-feeding How should I use this medicine? Take this medicine by mouth with a full glass of water. Follow the directions on the prescription label. If the medicine upsets your stomach, take it with food or milk. Do not take more medicine than you are told to take. Talk to your pediatrician regarding the use of this medicine in children. Special care may be needed. Overdosage: If you  think you have taken too much of this medicine contact a poison control center or emergency room at once. NOTE: This medicine is only for you. Do not share this medicine with others. What if I miss a dose? If you miss a dose, take it as soon as you can. If it is almost time for your next dose, take only that dose. Do not take double or extra doses. What may interact with this medicine? Do not take this medicine with any of the following medications: -MAOIs like Carbex, Eldepryl, Marplan, Nardil, and Parnate This medicine may also interact with the following medications: -alcohol or medicines that contain alcohol -antihistamines -benzodiazepines -bupropion -carbamazepine or oxcarbazepine -clozapine -cyclobenzaprine -digoxin -furazolidone -linezolid -medicines for depression, anxiety, or psychotic disturbances -medicines for migraine headache like almotriptan, eletriptan, frovatriptan, naratriptan, rizatriptan, sumatriptan, zolmitriptan -medicines for pain like pentazocine, buprenorphine, butorphanol, meperidine, nalbuphine, and propoxyphene -medicines for sleep -muscle relaxants -naltrexone -phenobarbital -phenothiazines like perphenazine, thioridazine, chlorpromazine, mesoridazine, fluphenazine, prochlorperazine, promazine, and trifluoperazine -procarbazine -warfarin This list may not describe all possible interactions. Give your health care provider a list of all the medicines, herbs, non-prescription drugs, or dietary supplements you use. Also tell them if you smoke, drink alcohol, or use illegal drugs. Some items may interact with your medicine. What should I watch for while using this medicine? Tell your doctor or health care professional if your pain does not go away, if it gets worse, or if you have new or a different type of pain. You may develop tolerance to the medicine. Tolerance means that you will need a higher dose of the medicine for pain relief. Tolerance is normal and is  expected if you take this medicine for a long time. Do not  suddenly stop taking your medicine because you may develop a severe reaction. Your body becomes used to the medicine. This does NOT mean you are addicted. Addiction is a behavior related to getting and using a drug for a non-medical reason. If you have pain, you have a medical reason to take pain medicine. Your doctor will tell you how much medicine to take. If your doctor wants you to stop the medicine, the dose will be slowly lowered over time to avoid any side effects. You may get drowsy or dizzy. Do not drive, use machinery, or do anything that needs mental alertness until you know how this medicine affects you. Do not stand or sit up quickly, especially if you are an older patient. This reduces the risk of dizzy or fainting spells. Alcohol can increase or decrease the effects of this medicine. Avoid alcoholic drinks. You may have constipation. Try to have a bowel movement at least every 2 to 3 days. If you do not have a bowel movement for 3 days, call your doctor or health care professional. Your mouth may get dry. Chewing sugarless gum or sucking hard candy, and drinking plenty of water may help. Contact your doctor if the problem does not go away or is severe. What side effects may I notice from receiving this medicine? Side effects that you should report to your doctor or health care professional as soon as possible: -allergic reactions like skin rash, itching or hives, swelling of the face, lips, or tongue -breathing difficulties, wheezing -confusion -itching -light headedness or fainting spells -redness, blistering, peeling or loosening of the skin, including inside the mouth -seizures Side effects that usually do not require medical attention (report to your doctor or health care professional if they continue or are bothersome): -constipation -dizziness -drowsiness -headache -nausea, vomiting This list may not describe all  possible side effects. Call your doctor for medical advice about side effects. You may report side effects to FDA at 1-800-FDA-1088. Where should I keep my medicine? Keep out of the reach of children. Store at room temperature between 15 and 30 degrees C (59 and 86 degrees F). Keep container tightly closed. Throw away any unused medicine after the expiration date. NOTE: This sheet is a summary. It may not cover all possible information. If you have questions about this medicine, talk to your doctor, pharmacist, or health care provider.  2015, Elsevier/Gold Standard. (2009-11-28 11:55:44)

## 2014-12-26 NOTE — Progress Notes (Signed)
   Subjective:    Patient ID: Kevin Carpenter, male    DOB: 1955-07-07, 59 y.o.   MRN: [id number]  HPI Patient is 59 year old gentleman returns a [facility] for further evaluation and treatment of pain involving the region of the lower extremities especially the feet. She has undergone treatment by podiatrist and has had continued pain despite prior treatment. We elected to avoid interventional treatment due to patient's anticoagulant therapy. Patient has had some improvement of his pain with the medication Neurontin. We will continue Neurontin and we will also add Ultram to patient's treatment regimen. Patient has been cautioned regarding respiratory depression confusion and other side effects which can occur with the use of these medications. We may consider interventional treatment should patient be without continued improvement with the noninterventional treatment measures. The patient was in agreement with suggested treatment plan.   Review of Systems     Objective:   Physical Exam  There was tends to palpation of the splenius capitis and occipitalis musculature regions of mild degree. There was mild tennis over the cervical facet cervical paraspinal muscles as well as the thoracic facet thoracic paraspinal musculature region. Palpation of the acromioclavicular and glenohumeral joint regions reproduced minimal discomfort. Patient was with bilaterally equal grip strength. Tinel and Phalen's maneuver were without increase of pain of significant degree. There was no crepitus of the thoracic region noted. There was unremarkable Spurling's maneuver. Tinel and Phalen's maneuver were without increased pain of any significant degree. Palpation over the lumbar paraspinal musculature region lumbar facet region was with minimal discomfort. Lateral bending and rotation and extension and palpation of the lumbar facets reproduced minimal discomfort. DTRs are trace at the knees. Minimal tenderness of the  greater trochanteric region and iliotibial band region. Straight leg raising was tolerated to 30 without increased pain with dorsiflexion noted. There was tenderness to palpation of the feet without definite allodynia noted. There was tends to palpation plantar aspect of the foot predominantly. Dorsiflexion was associated with increased pain of the feet increased back pain of lower extremity pain. No sensory deficit of dermatomal distribution was detected. There was negative clonus negative Homans.      Assessment & Plan:    Plantar fasciitis  Complex regional pain syndrome of the lower extremities  Neuralgia of feet  Subjective:    Plan   Continue present medication Neurontin as discussed and began Ultram. The patient was cautioned regarding respiratory depression confusion and other side effects which can occur with these medications  F/U PCP Dr.Chabon   for evaliation of  BP and general medical  condition  F/U surgical evaluation The patient will follow-up with Dr. Milinda Pointer as discussed  F/U neurological evaluation with [facility] EMG studies may be considered  May consider radiofrequency rhizolysis or intraspinal procedures pending response to present treatment and F/U evaluation   Patient to call Pain Management Center should patient have concerns prior to scheduled return appointmen.

## 2015-01-01 ENCOUNTER — Telehealth: Payer: Self-pay | Admitting: Pain Medicine

## 2015-01-01 NOTE — Telephone Encounter (Signed)
Thank you :)

## 2015-01-01 NOTE — Telephone Encounter (Signed)
Called patient and left msg with his wife for patient to call office.Called patient on cell phone [phone] and he will be able to come in the morning to see Dr Primus Bravo.

## 2015-01-01 NOTE — Telephone Encounter (Signed)
Having headaches since starting new meds last week

## 2015-01-01 NOTE — Telephone Encounter (Signed)
Patient having headaches since starting the tramadol.  Left msg with wife to have the patient call us.

## 2015-01-02 ENCOUNTER — Ambulatory Visit: Payer: 59 | Attending: Pain Medicine | Admitting: Pain Medicine

## 2015-01-02 ENCOUNTER — Encounter: Payer: Self-pay | Admitting: Pain Medicine

## 2015-01-02 VITALS — BP 109/80 | HR 69 | Temp 97.1°F | Resp 15 | Ht 73.0 in | Wt 185.0 lb

## 2015-01-02 DIAGNOSIS — M722 Plantar fascial fibromatosis: Secondary | ICD-10-CM | POA: Diagnosis not present

## 2015-01-02 DIAGNOSIS — M792 Neuralgia and neuritis, unspecified: Secondary | ICD-10-CM | POA: Diagnosis not present

## 2015-01-02 DIAGNOSIS — R51 Headache: Secondary | ICD-10-CM | POA: Diagnosis present

## 2015-01-02 DIAGNOSIS — G90529 Complex regional pain syndrome I of unspecified lower limb: Secondary | ICD-10-CM | POA: Insufficient documentation

## 2015-01-02 DIAGNOSIS — M79671 Pain in right foot: Secondary | ICD-10-CM | POA: Diagnosis present

## 2015-01-02 DIAGNOSIS — G90523 Complex regional pain syndrome I of lower limb, bilateral: Secondary | ICD-10-CM

## 2015-01-02 DIAGNOSIS — M79672 Pain in left foot: Secondary | ICD-10-CM | POA: Diagnosis present

## 2015-01-02 MED ORDER — HYDROCODONE-ACETAMINOPHEN 5-325 MG PO TABS
ORAL_TABLET | ORAL | Status: DC
Start: 2015-01-02 — End: 2015-01-30

## 2015-01-02 MED ORDER — GABAPENTIN 300 MG PO CAPS: ORAL_CAPSULE | ORAL | Status: DC

## 2015-01-02 NOTE — Patient Instructions (Addendum)
PLAN   Continue present medication Neurontin and stop tramadol. Begin hydrocodone acetaminophen. Hydrocodone acetaminophen can cause drowsiness confusion excessive sedation and other side effects  F/U PCP Dr.Chabon  for evaliation of  BP and general medical  condition  F/U surgical evaluation. May consider pending follow-up evaluations  F/U neurological evaluation. May consider pending follow-up evaluations  May consider radiofrequency rhizolysis or intraspinal procedures pending response to present treatment and F/U evaluation   Patient to call Pain Management Center should patient have concerns prior to scheduled return appointment.

## 2015-01-02 NOTE — Progress Notes (Signed)
   Subjective:    Patient ID: Kevin Carpenter, male    DOB: 02/02/1956, 59 y.o.   MRN: [id number]  HPI  Patient is 59 year old gentleman returns a [facility] for further evaluation and treatment of pain involving the feet. Patient states that he was unable to tolerate tramadol. Patient will continue Neurontin as prescribed at this time and we will adjust patient's medications by replacing patient's tramadol with hydrocodone acetaminophen. The patient stated that tramadol appeared to be precipitating headaches. The patient preferred to avoid continued use of tramadol. We will continue noninterventional treatment while patient continues anticoagulant therapy and we will observe patient's response to Neurontin and hydrocodone acetaminophen. The patient was cautioned regarding respiratory depression confusion excessive sedation and other side effects. The patient was with understanding and in agreement with suggested treatment plan.     Review of Systems     Objective:   Physical Exam  There was mild tennis over the splenius capitis and occipitalis musculature regions. Patient was with bilaterally equal grip strength with unremarkable Spurling's maneuver. There was minimal tenderness of the acromioclavicular and glenohumeral joint regions. Palpation of the cervical facet and thoracic facet paraspinal musculature regions were without increased pain of any significant degree. Patient appeared to be with bilaterally equal grip strength with Tinel and Phalen's maneuver being unremarkable. There was minimal tenderness over the thoracic facet thoracic paraspinal musculature region and no crepitus of the thoracic region was noted. Palpation over the lumbar paraspinal muscles lumbar facet region was without increased pain of significant degree. Lateral bending and rotation and extension and palpation of the lumbar facets reproduce mild discomfort. Straight leg raising was tolerates approximately 30  without increased pain with dorsiflexion noted. Palpation of the feet was with mild tenderness to palpation with without increased pain with straight leg raising and dorsiflexion. EHL strength was slightly decreased. There was mild tenderness over the PSIS and PII S region as well as the greater trochanteric region and iliotibial band region. No sensory deficit of dermatomal distribution of the extremities noted. Abdomen was nontender with no costovertebral angle tenderness noted.      Assessment & Plan:  Plantar fasciitis  Complex regional pain syndrome of the lower extremities  Neuralgia of feet    PLAN   Continue present medication Neurontin and stopped tramadol. Begin hydrocodone acetaminophen. Patient reminded of respiratory depression confusion excessive sedation and other side effects of medications  F/U PCP for evaliation of  BP and general medical  condition  F/U surgical evaluation. May consider pending follow-up evaluations  F/U neurological evaluation. May consider pending follow-up evaluations  May consider radiofrequency rhizolysis or intraspinal procedures pending response to present treatment and F/U evaluation   Patient to call [facility] should patient have concerns prior to scheduled return appointment.

## 2015-01-02 NOTE — Progress Notes (Signed)
Safety precautions to be maintained throughout the outpatient stay will include: orient to surroundings, keep bed in low position, maintain call bell within reach at all times, provide assistance with transfer out of bed and ambulation.  

## 2015-01-04 ENCOUNTER — Telehealth: Payer: Self-pay | Admitting: *Deleted

## 2015-01-04 NOTE — Telephone Encounter (Signed)
Pt's wife, Dorene request pt's office notes from Dr. Glena Norfolk faxed to Dr. Nona Dell office fax [phone].

## 2015-01-05 NOTE — Telephone Encounter (Signed)
Waiting on copy of signed medical release to be faxed to me so I can fax records to Dr. Doran Durand @ [facility].

## 2015-01-15 ENCOUNTER — Encounter: Payer: No Typology Code available for payment source | Admitting: Neurology

## 2015-01-16 ENCOUNTER — Telehealth: Payer: Self-pay | Admitting: Neurology

## 2015-01-16 NOTE — Telephone Encounter (Signed)
I called the patient's wife and advised that, per Dr. Jannifer Franklin, the patient does not need to stop/hold his Xarelto. She thanked me for calling.

## 2015-01-16 NOTE — Telephone Encounter (Signed)
Wife called regarding medications to take or not take in regards to NCS/EMG 01/17/15. Patient does take Xarelto. Please call to advise.

## 2015-01-17 ENCOUNTER — Ambulatory Visit (INDEPENDENT_AMBULATORY_CARE_PROVIDER_SITE_OTHER): Payer: Self-pay | Admitting: Neurology

## 2015-01-17 ENCOUNTER — Encounter: Payer: Self-pay | Admitting: Neurology

## 2015-01-17 ENCOUNTER — Other Ambulatory Visit: Payer: Self-pay | Admitting: Pain Medicine

## 2015-01-17 ENCOUNTER — Ambulatory Visit (INDEPENDENT_AMBULATORY_CARE_PROVIDER_SITE_OTHER): Payer: No Typology Code available for payment source | Admitting: Neurology

## 2015-01-17 ENCOUNTER — Telehealth: Payer: Self-pay | Admitting: *Deleted

## 2015-01-17 DIAGNOSIS — M25572 Pain in left ankle and joints of left foot: Secondary | ICD-10-CM

## 2015-01-17 DIAGNOSIS — M25579 Pain in unspecified ankle and joints of unspecified foot: Secondary | ICD-10-CM

## 2015-01-17 DIAGNOSIS — M722 Plantar fascial fibromatosis: Secondary | ICD-10-CM

## 2015-01-17 DIAGNOSIS — M25571 Pain in right ankle and joints of right foot: Secondary | ICD-10-CM | POA: Diagnosis not present

## 2015-01-17 NOTE — Progress Notes (Signed)
Please refer to EMG and nerve conduction study procedure note. 

## 2015-01-17 NOTE — Procedures (Signed)
     HISTORY:  Kevin Carpenter is a 59 year old gentleman with a long-standing history of bilateral heel pain that is worse with weightbearing, better when he is off of his feet. The patient does have a history of some low back pain without radiation down the legs. The patient being evaluated for possible neuropathy or a lumbar radiculopathy.  NERVE CONDUCTION STUDIES:  Nerve conduction studies were performed on both lower extremities. The distal motor latencies and motor amplitudes for the peroneal and posterior tibial nerves were within normal limits. The nerve conduction velocities for these nerves were also normal. The H reflex latencies were prolonged bilaterally. The sensory latencies for the peroneal nerves, and for the medial and lateral plantar sensory nerves were within normal limits.   EMG STUDIES:  EMG study was performed on the left lower extremity:  The tibialis anterior muscle reveals 2 to 4K motor units with full recruitment. No fibrillations or positive waves were seen. The peroneus tertius muscle reveals 2 to 4K motor units with full recruitment. No fibrillations or positive waves were seen. The medial gastrocnemius muscle reveals 1 to 3K motor units with full recruitment. No fibrillations or positive waves were seen. The vastus lateralis muscle reveals 2 to 4K motor units with full recruitment. No fibrillations or positive waves were seen. The iliopsoas muscle reveals 2 to 4K motor units with full recruitment. No fibrillations or positive waves were seen. The biceps femoris muscle (long head) reveals 2 to 4K motor units with full recruitment. No fibrillations or positive waves were seen. The lumbosacral paraspinal muscles were tested at 3 levels, and revealed no abnormalities of insertional activity at all 3 levels tested. There was good relaxation.  EMG study was performed on the right lower extremity:  The tibialis anterior muscle reveals 2 to 4K motor units with full  recruitment. No fibrillations or positive waves were seen. The peroneus tertius muscle reveals 2 to 4K motor units with full recruitment. No fibrillations or positive waves were seen. The medial gastrocnemius muscle reveals 1 to 3K motor units with full recruitment. No fibrillations or positive waves were seen. The vastus lateralis muscle reveals 2 to 4K motor units with full recruitment. No fibrillations or positive waves were seen. The iliopsoas muscle reveals 2 to 4K motor units with full recruitment. No fibrillations or positive waves were seen. The biceps femoris muscle (long head) reveals 2 to 4K motor units with full recruitment. No fibrillations or positive waves were seen. The lumbosacral paraspinal muscles were tested at 3 levels, and revealed no abnormalities of insertional activity at all 3 levels tested. There was good relaxation.   IMPRESSION:  Nerve conduction studies done on both lower extremities were unremarkable, without evidence of a peripheral neuropathy. No evidence of tarsal tunnel syndrome is seen. EMG evaluation of both lower extremities were unremarkable, without evidence of an overlying lumbosacral radiculopathy.  Jill Alexanders MD 01/17/2015 10:56 AM  [facility] [address] [place], [place] [address]  Phone [phone] Fax [phone]

## 2015-01-18 ENCOUNTER — Other Ambulatory Visit: Payer: Self-pay | Admitting: Pain Medicine

## 2015-01-25 ENCOUNTER — Telehealth: Payer: Self-pay | Admitting: *Deleted

## 2015-01-25 ENCOUNTER — Encounter: Payer: No Typology Code available for payment source | Admitting: Pain Medicine

## 2015-01-25 NOTE — Telephone Encounter (Signed)
Informed pt of Dr. Stephenie Acres recommendation to return for an appt to discuss neurology referral information.  Pt states will need to call again to reschedule.

## 2015-01-30 ENCOUNTER — Ambulatory Visit: Payer: 59 | Attending: Pain Medicine | Admitting: Pain Medicine

## 2015-01-30 ENCOUNTER — Encounter: Payer: Self-pay | Admitting: Pain Medicine

## 2015-01-30 VITALS — BP 113/75 | HR 62 | Temp 97.3°F | Resp 15 | Ht 73.0 in | Wt 185.0 lb

## 2015-01-30 DIAGNOSIS — G90523 Complex regional pain syndrome I of lower limb, bilateral: Secondary | ICD-10-CM | POA: Diagnosis not present

## 2015-01-30 DIAGNOSIS — M722 Plantar fascial fibromatosis: Secondary | ICD-10-CM

## 2015-01-30 DIAGNOSIS — M79605 Pain in left leg: Secondary | ICD-10-CM | POA: Diagnosis present

## 2015-01-30 DIAGNOSIS — G588 Other specified mononeuropathies: Secondary | ICD-10-CM | POA: Insufficient documentation

## 2015-01-30 DIAGNOSIS — M79604 Pain in right leg: Secondary | ICD-10-CM | POA: Diagnosis present

## 2015-01-30 MED ORDER — HYDROCODONE-ACETAMINOPHEN 5-325 MG PO TABS: ORAL_TABLET | ORAL | Status: DC

## 2015-01-30 MED ORDER — GABAPENTIN 300 MG PO CAPS: ORAL_CAPSULE | ORAL | Status: DC

## 2015-01-30 NOTE — Patient Instructions (Addendum)
PLAN   Continue present medication Neurontin and  hydrocodone acetaminophen. May take 9 Neurontin per day for 1 week if tolerated then increase to 10 Neurontin per day if tolerated without drowsiness confusion and other side effects   F/U PCP Dr.Chabon  for evaliation of  BP and general medical  condition  F/U surgical evaluation. May consider pending follow-up evaluations  F/U neurological evaluation. May consider pending follow-up evaluations  May consider radiofrequency rhizolysis or intraspinal procedures pending response to present treatment and F/U evaluation   Patient to call [facility] should patient have concerns prior to scheduled return appointment.

## 2015-01-30 NOTE — Progress Notes (Signed)
   Subjective:    Patient ID: Kevin Carpenter, male    DOB: 11-24-1955, 59 y.o.   MRN: [id number]  HPI Patient is 59 year old gentleman who returns to [facility] for further evaluation and treatment of pain involving the lower extremity regions especially the feet. Patient states that pain is improved significantly since patient is undergone treatment and [facility]. Patient states that he continues to have some pain involving the heel with sharp shooting burning sensations occurring in the region of the heels. We discussed patient's medications including Neurontin and will have patient advanced Neurontin by 1 more pill per day for 1 week and advance by one additional pill per day the following week if tolerated without drowsiness confusion and other undesirable side effects. The patient stated that he was tolerated Neurontin well which is been of significant benefit. We will avoid interventional treatment as discussed with patient and we'll avoid interrupting the patient's anticoagulant therapy. The patient was understanding and in agreement with suggested treatment plan   Review of Systems     Objective:   Physical Exam  There was tenderness of the splenius capitis and occipitalis musculature region a minimal degree with minimal tenderness over the region cervical facet cervical paraspinal musculature regions. Palpation of the acromioclavicular and glenohumeral joint region reproduced pain of minimal degree. Patient was with bilaterally equal grip strength. Tinel and Phalen's maneuver were without increase of pain of significant degree. Palpation over the thoracic facet thoracic paraspinal musculature region was with minimal discomfort with no evidence of significant crepitus of the thoracic region noted. Palpation over the lumbar paraspinal musculatures and lumbar facet region was with mild discomfort. Lateral bending and rotation extension and palpation over the lumbar facets  reproduce mild discomfort. Straight leg raising tolerates approximately 30 without increased pain noted with dorsiflexion noted there was mild tends to palpation of the PSIS PSIS region as well as the gluteal and piriformis musculature regions. DTRs were difficult to elicit patient had difficulty relaxing. No excessive tends to palpation of the lower extremities noted. The feet were without excessive tenderness to palpation on today's visit. No areas of allodynia were noted. There was slight 10 tenderness to palpation of the region of the foot plantar aspect near the calcaneus No sensory deficit of dermatomal distribution of the lower extremities noted. Negative clonus negative Homans. Abdomen nontender with no costovertebral tenderness noted.    Assessment & Plan:    Plantar fasciitis  Complex regional pain syndrome of the lower extremities  Neuralgia of feet     PLAN    Continue present medication Neurontin and hydrocodone acetaminophen  F/U PCP Dr.  Margit Banda for evaliation of  BP and general medical  condition  F/U surgical evaluation. May consider pending follow-up evaluations  F/U with podiatrist  F/U neurological evaluation. May consider pending follow-up evaluations  May consider radiofrequency rhizolysis or intraspinal procedures pending response to present treatment and F/U evaluation   Patient to call [facility] should patient have concerns prior to scheduled return appointment.

## 2015-02-06 ENCOUNTER — Other Ambulatory Visit: Payer: Self-pay | Admitting: Family Medicine

## 2015-02-12 DIAGNOSIS — Z86711 Personal history of pulmonary embolism: Secondary | ICD-10-CM | POA: Insufficient documentation

## 2015-02-12 DIAGNOSIS — I82409 Acute embolism and thrombosis of unspecified deep veins of unspecified lower extremity: Secondary | ICD-10-CM | POA: Insufficient documentation

## 2015-02-12 DIAGNOSIS — IMO0001 Reserved for inherently not codable concepts without codable children: Secondary | ICD-10-CM | POA: Insufficient documentation

## 2015-02-13 ENCOUNTER — Ambulatory Visit (INDEPENDENT_AMBULATORY_CARE_PROVIDER_SITE_OTHER): Payer: 59 | Admitting: Family Medicine

## 2015-02-13 ENCOUNTER — Encounter: Payer: Self-pay | Admitting: Family Medicine

## 2015-02-13 VITALS — BP 102/70 | HR 77 | Temp 97.4°F | Resp 16 | Ht 73.0 in | Wt 189.6 lb

## 2015-02-13 DIAGNOSIS — Z23 Encounter for immunization: Secondary | ICD-10-CM | POA: Diagnosis not present

## 2015-02-13 DIAGNOSIS — Z0000 Encounter for general adult medical examination without abnormal findings: Secondary | ICD-10-CM | POA: Diagnosis not present

## 2015-02-13 NOTE — Addendum Note (Signed)
Addended by: Quay Burow on: 02/13/2015 04:04 PM   Modules accepted: Miquel Dunn

## 2015-02-13 NOTE — Progress Notes (Signed)
Subjective:     Patient ID: Kevin Carpenter, male   DOB: September 28, 1955, 59 y.o.   MRN: [id number]  HPI  Chief Complaint  Patient presents with  . Annual Exam    Patient comes in office today for his annual physical, he states that he has no questions or concerns today  States he had recent prostate exam and PSA (3.4) per his urologist, Dr. Amalia Hailey.   Review of Systems General: Feeling well HEENT: regular dental visits and eye exams. Cardiovascular: no chest pain, shortness of breath, or palpitations.Remains on Xarelto due to hx of P.E. Sees cardiology, Dr. Nehemiah Massed, annually. GI: no heartburn, no change in bowel habits or blood in the stool. Has received reminder from G.I., Dr. Vira Agar, regarding 5 year f/u colonoscopy.States he will self schedule. GU: nocturia x 0-1; no change in bladder habits  Psychiatric: not depressed. Occasional use of diazepam for anxiety. Musculoskeletal: Has received several evaluations regarding recalcitrant plantar fasciitis from Dr. Milinda Pointer, podiatry; Dr. Primus Bravo, pain clinic; Dr. Jannifer Franklin, neurology; and last orthopedics, Dr. Doran Durand. Reports improvement in his sx.    Objective:   Physical Exam  Constitutional: He appears well-developed and well-nourished. No distress.  Eyes: PERRLA, EOMI Neck: no thyromegaly, tenderness or nodules, no cervical adenopathy, no carotid bruits.  ENT: TM's intact without inflammation; No tonsillar enlargement or exudate, Lungs: Clear Heart : RRR without murmur or gallop Abd: bowel sounds present, soft, non-tender, no organomegaly Extremities: no edema     Assessment:    1. Need for influenza vaccination - Flu Vaccine QUAD 36+ mos IM  2. Annual physical exam - Comprehensive metabolic panel - Lipid panel    Plan:    Further f/u pending lab work.

## 2015-02-13 NOTE — Patient Instructions (Signed)
We will call you with the lab results. 

## 2015-02-20 ENCOUNTER — Telehealth: Payer: Self-pay

## 2015-02-20 LAB — COMPREHENSIVE METABOLIC PANEL
ALBUMIN: 4.4 g/dL (ref 3.5–5.5)
ALT: 16 IU/L (ref 0–44)
AST: 16 IU/L (ref 0–40)
Albumin/Globulin Ratio: 1.9 (ref 1.1–2.5)
Alkaline Phosphatase: 85 IU/L (ref 39–117)
BUN / CREAT RATIO: 11 (ref 9–20)
BUN: 11 mg/dL (ref 6–24)
Bilirubin Total: 0.5 mg/dL (ref 0.0–1.2)
CHLORIDE: 103 mmol/L (ref 97–106)
CO2: 25 mmol/L (ref 18–29)
Calcium: 9.8 mg/dL (ref 8.7–10.2)
Creatinine, Ser: 1 mg/dL (ref 0.76–1.27)
GFR calc non Af Amer: 82 mL/min/{1.73_m2} (ref >59)
GFR, EST AFRICAN AMERICAN: 95 mL/min/{1.73_m2} (ref >59)
GLUCOSE: 104 mg/dL — AB (ref 65–99)
Globulin, Total: 2.3 g/dL (ref 1.5–4.5)
Potassium: 4.7 mmol/L (ref 3.5–5.2)
SODIUM: 142 mmol/L (ref 136–144)
TOTAL PROTEIN: 6.7 g/dL (ref 6.0–8.5)

## 2015-02-20 LAB — LIPID PANEL
CHOL/HDL RATIO: 5.1 ratio — AB (ref 0.0–5.0)
CHOLESTEROL TOTAL: 221 mg/dL — AB (ref 100–199)
HDL: 43 mg/dL (ref >39)
LDL CALC: 140 mg/dL — AB (ref 0–99)
Triglycerides: 188 mg/dL — ABNORMAL HIGH (ref 0–149)
VLDL CHOLESTEROL CAL: 38 mg/dL (ref 5–40)

## 2015-02-20 NOTE — Telephone Encounter (Signed)
Unable to reach patient he was on the field at work, advised wife of lab report since it is stated in chart to release medical information to her. She states that she will advise patient and work on helping him get more active and proper food choices. KW

## 2015-02-20 NOTE — Telephone Encounter (Signed)
Left message with wife for patient to call back office. KW

## 2015-02-20 NOTE — Telephone Encounter (Signed)
-----   Message from Carmon Ginsberg, [place] sent at 02/20/2015  7:50 AM EST ----- Mild elevation of sugar and cholesterol. Your calculated 10 year risk for developing heart disease is 6.6%. We usually recommend cholesterol lowering drugs for a > 7.5% risk. Encourage regular exercise and low fat food choices. Recheck annually.

## 2015-02-20 NOTE — Telephone Encounter (Signed)
Pt returned your call. ° °ThanksTeri °

## 2015-03-01 ENCOUNTER — Encounter: Payer: Self-pay | Admitting: Pain Medicine

## 2015-03-01 ENCOUNTER — Ambulatory Visit: Payer: 59 | Attending: Pain Medicine | Admitting: Pain Medicine

## 2015-03-01 VITALS — BP 111/86 | HR 61 | Temp 98.3°F | Resp 18 | Ht 73.0 in | Wt 185.0 lb

## 2015-03-01 DIAGNOSIS — M79604 Pain in right leg: Secondary | ICD-10-CM | POA: Diagnosis present

## 2015-03-01 DIAGNOSIS — G588 Other specified mononeuropathies: Secondary | ICD-10-CM | POA: Insufficient documentation

## 2015-03-01 DIAGNOSIS — G90523 Complex regional pain syndrome I of lower limb, bilateral: Secondary | ICD-10-CM

## 2015-03-01 DIAGNOSIS — M722 Plantar fascial fibromatosis: Secondary | ICD-10-CM | POA: Insufficient documentation

## 2015-03-01 DIAGNOSIS — M79605 Pain in left leg: Secondary | ICD-10-CM | POA: Diagnosis present

## 2015-03-01 MED ORDER — HYDROCODONE-ACETAMINOPHEN 5-325 MG PO TABS: ORAL_TABLET | ORAL | Status: DC

## 2015-03-01 MED ORDER — GABAPENTIN 300 MG PO CAPS: ORAL_CAPSULE | ORAL | Status: DC

## 2015-03-01 NOTE — Patient Instructions (Addendum)
Continue present medication Neurontin and  hydrocodone acetaminophen. Do not get Neurontin prescription filled until we can verify your prescription with [facility] this morning Ask nurses to call you after they verify prescription with [facility]  F/U PCP Dr.Chabon  for evaliation of  BP and general medical  condition  F/U surgical evaluation. May consider pending follow-up evaluations  F/U neurological evaluation. May consider pending follow-up evaluations  Continue exercises as discussed  May consider radiofrequency rhizolysis or intraspinal procedures pending response to present treatment and F/U evaluation   Patient to call [facility] should patient have concerns prior to scheduled return appointment.  A prescription for NEURONTIN was sent to your pharmacy and should be available for pickup today.  A prescription for HYDROCODONE was given to you today.

## 2015-03-01 NOTE — Progress Notes (Signed)
   Subjective:    Patient ID: Kevin Carpenter, male    DOB: 1955/09/29, 59 y.o.   MRN: [id number]  HPI Patient is a 59 year old gentleman who returns to pain management Center for further evaluation and treatment of pain involving the region of the lower extremity regions. Patient is with burning stinging sensation of the lower extremities involving the region of the heels especially. The patient has had improvement of his pain with medications consisting of gabapentin and hydrocodone acetaminophen. At the present time we will avoid interventional treatment due to patient's continued anticoagulant therapy. The patient is quite satisfied with the amount of relief of pain he has obtained with the medication regimen presently being implemented. We will continue gabapentin and hydrocodone acetaminophen as prescribed and patient is to call pain management should there be concerns regarding condition or change in condition prior to scheduled return appointment. The patient agreed to suggested treatment plan   Review of Systems     Objective:   Physical Exam   There was tends to palpation of the paraspinal musculatures and cervical region cervical facet region palpation which be produced pain of minimal degree. There was minimal tenderness to palpation of the splenius capitis and a separate talus musculature regions. Palpation of the acromioclavicular and glenohumeral joint regions reproduces minimal discomfort. Patient appeared to be with bilaterally equal grip strength. Tinel and Phalen's maneuver were without increase of pain of significant degree. Palpation over the lower thoracic region was with mild tenderness to palpation and mild muscle spasm. Palpation over the lumbar paraspinal musculatures and lumbar facet region was attends to palpation of mild to moderate degree. Lateral bending rotation extension and palpation of the lumbar facets reproduce mild discomfort. Straight leg raising was tolerates  approximately 30 without increased pain with dorsiflexion noted. There was negative clonus negative Homans. No sensory deficit or dermatomal dystrophy detected. Palpation of the feet reveal patient to be with tenderness to palpation without evidence of allodynia. There was tenderness to palpation of the calcaneus as well as plantar aspect of the foot of mild to moderate degree. There was negative Homans. Abdomen nontender with no costovertebral tenderness noted.      Assessment & Plan:    Plantar fasciitis  Complex regional pain syndrome of the lower extremities  Neuralgia of feet     PLAN    Continue present medication Neurontin and  hydrocodone acetaminophen. Do not get Neurontin prescription filled until we can verify your prescription with [facility] morning Ask nurses to call you after they verify prescription with [facility]  F/U PCP Dr.Chabon  for evaliation of  BP and general medical  condition  F/U surgical evaluation. May consider pending follow-up evaluations  F/U neurological evaluation. May consider pending follow-up evaluations  Continue exercises as discussed  May consider radiofrequency rhizolysis or intraspinal procedures pending response to present treatment and F/U evaluation   Patient to call [facility] Management Center should patient have concerns prior to scheduled return

## 2015-03-12 ENCOUNTER — Encounter: Payer: 59 | Admitting: Pain Medicine

## 2015-03-19 ENCOUNTER — Encounter: Payer: 59 | Admitting: Pain Medicine

## 2015-03-20 DIAGNOSIS — Z8679 Personal history of other diseases of the circulatory system: Secondary | ICD-10-CM

## 2015-03-20 DIAGNOSIS — Z87898 Personal history of other specified conditions: Secondary | ICD-10-CM | POA: Insufficient documentation

## 2015-03-20 DIAGNOSIS — E782 Mixed hyperlipidemia: Secondary | ICD-10-CM | POA: Insufficient documentation

## 2015-03-27 ENCOUNTER — Other Ambulatory Visit: Payer: Self-pay | Admitting: Internal Medicine

## 2015-03-27 ENCOUNTER — Ambulatory Visit
Admission: RE | Admit: 2015-03-27 | Discharge: 2015-03-27 | Disposition: A | Discharge status: Home / Self Care | Payer: 59 | Source: Ambulatory Visit | Attending: Internal Medicine | Admitting: Internal Medicine

## 2015-03-27 DIAGNOSIS — I712 Thoracic aortic aneurysm, without rupture: Secondary | ICD-10-CM | POA: Insufficient documentation

## 2015-03-27 DIAGNOSIS — R0602 Shortness of breath: Secondary | ICD-10-CM

## 2015-03-27 DIAGNOSIS — R911 Solitary pulmonary nodule: Secondary | ICD-10-CM | POA: Insufficient documentation

## 2015-03-27 MED ORDER — IOHEXOL 350 MG/ML SOLN
100.0000 mL | Freq: Once | INTRAVENOUS | Status: AC | PRN
  Administered 2015-03-27: 100 mL via INTRAVENOUS

## 2015-04-02 ENCOUNTER — Other Ambulatory Visit: Payer: Self-pay | Admitting: Family Medicine

## 2015-04-03 ENCOUNTER — Other Ambulatory Visit: Payer: Self-pay | Admitting: Family Medicine

## 2015-04-05 ENCOUNTER — Other Ambulatory Visit: Payer: Self-pay | Admitting: Pain Medicine

## 2015-04-11 NOTE — Telephone Encounter (Signed)
RX called in at [facility]  

## 2015-08-21 ENCOUNTER — Ambulatory Visit: Payer: 59 | Attending: Pain Medicine | Admitting: Pain Medicine

## 2015-08-21 ENCOUNTER — Encounter: Payer: Self-pay | Admitting: Pain Medicine

## 2015-08-21 VITALS — BP 118/80 | HR 64 | Temp 97.6°F | Resp 16 | Ht 73.0 in | Wt 188.0 lb

## 2015-08-21 DIAGNOSIS — M722 Plantar fascial fibromatosis: Secondary | ICD-10-CM | POA: Insufficient documentation

## 2015-08-21 DIAGNOSIS — G588 Other specified mononeuropathies: Secondary | ICD-10-CM | POA: Insufficient documentation

## 2015-08-21 DIAGNOSIS — G90523 Complex regional pain syndrome I of lower limb, bilateral: Secondary | ICD-10-CM | POA: Diagnosis not present

## 2015-08-21 DIAGNOSIS — M79672 Pain in left foot: Secondary | ICD-10-CM | POA: Diagnosis present

## 2015-08-21 DIAGNOSIS — M79671 Pain in right foot: Secondary | ICD-10-CM | POA: Diagnosis present

## 2015-08-21 MED ORDER — GABAPENTIN 300 MG PO CAPS: ORAL_CAPSULE | ORAL | Status: DC

## 2015-08-21 MED ORDER — HYDROCODONE-ACETAMINOPHEN 5-325 MG PO TABS: ORAL_TABLET | ORAL | Status: DC

## 2015-08-21 NOTE — Progress Notes (Signed)
   Subjective:    Patient ID: Kevin Carpenter, male    DOB: Jan 01, 1956, 60 y.o.   MRN: [id number]  HPI  The patient is a 60 year old gentleman who returns to pain management for further evaluation and treatment of pain involving the feet predominantly. The patient states that he is undergone evaluation by several physicians as well as to chiropractors.. The patient states that he also had MRI and x-rays done nerve conduction studies without any obvious etiology for his symptoms being found Patient underwent evaluation by Dr.Hewitt since previous visit to pain management and is undergone prior evaluation and treatment by Dr. Milinda Pointer. The patient states that his pain continues and that he feels that he received the best and most effective treatment in the pain clinic. We discussed patient's condition and will resume Neurontin and hydrocodone acetaminophen. We will avoid interventional treatment. Patient continues Xarelto we will consider additional modifications of treatment pending follow-up evaluation. We will also consider Cymbalta as discussed with patient. All agreed to suggested treatment plan     Review of Systems     Objective:   Physical Exam  There was tenderness of the splenius capitis and occipitalis region palpation which reproduces minimal discomfort with minimal tenderness cervical facet cervical paraspinal musculature region as well as the acromioclavicular and glenohumeral joint region with patient being with unremarkable Spurling's maneuver. Palpation of the thoracic region was without increased pain of significant degree with mild tenderness of the lumbar paraspinal musculatures and lumbar facet region. Palpation of the PSIS and PII S region reproduced mild discomfort. The patient appeared to be with bilaterally equal grip strength and Tinel and Phalen's maneuver were without increased pain of significant degree. Straight leg raise was tolerates approximately 30 without increased pain with  dorsiflexion noted. There were no lesions of the feet noted there was no definite allodynia of the lower extremities noted. There was tenderness to palpation of the plantar aspect of feet with no obvious lesions of the lower extremities are the feet noted. There was negative Homans and no sensory deficit or dermatomal distribution detected. No costovertebral tenderness noted.      Assessment & Plan:     Plantar fasciitis  Complex regional pain syndrome of the lower extremities  Neuralgia of feet      PLAN  Continue present medications Neurontin and hydrocodone acetaminophen. We will consider Cymbalta as discussed  F/U PCP Dr.Chabon  for evaliation of  BP and general medical  condition  F/U surgical evaluation. Recommend follow-up evaluation with podiatrist Dr.Hyatt follow-up with Dr. Doran Durand and other physicians as needed   F/U neurological evaluation. May consider additional PNCV/EMG studies and other studies pending follow-up evaluations  Patient has undergone MRI since last visit to pain clinic area we may consider additional studies  F/U with chiropractor as needed  May consider radiofrequency rhizolysis or intraspinal procedures pending response to present treatment and F/U evaluation . We will avoid interventional treatment at this time due to Xarelto  as discussed  Patient to call [facility] Management Center should patient have concerns prior to scheduled return appointment.

## 2015-08-21 NOTE — Progress Notes (Signed)
Safety precautions to be maintained throughout the outpatient stay will include: orient to surroundings, keep bed in low position, maintain call bell within reach at all times, provide assistance with transfer out of bed and ambulation.  

## 2015-08-21 NOTE — Patient Instructions (Addendum)
Continue present medications Neurontin and hydrocodone acetaminophen. We will consider Cymbalta as discussed  F/U PCP Dr.Chabon  for evaliation of  BP and general medical  condition  F/U surgical evaluation. Recommend follow-up evaluation with podiatrist Dr.Hyatt follow-up with Dr. Doran Durand and other physicians as needed   F/U neurological evaluation. May consider PNCV/EMG studies and other studies pending follow-up evaluations  F/U with chiropractor as needed  May consider radiofrequency rhizolysis or intraspinal procedures pending response to present treatment and F/U evaluation . We will avoid interventional treatment at this time due to Xarelto  as discussed  Patient to call [facility] Management Center should patient have concerns prior to scheduled return appointment.

## 2015-08-28 ENCOUNTER — Telehealth: Payer: Self-pay

## 2015-08-28 ENCOUNTER — Other Ambulatory Visit: Payer: Self-pay | Admitting: Family Medicine

## 2015-08-28 DIAGNOSIS — F419 Anxiety disorder, unspecified: Secondary | ICD-10-CM

## 2015-08-28 MED ORDER — DIAZEPAM 5 MG PO TABS: 5.0000 mg | ORAL_TABLET | Freq: Three times a day (TID) | ORAL | Status: DC | PRN

## 2015-08-28 NOTE — Telephone Encounter (Signed)
-----   Message from Carmon Ginsberg, [place] sent at 08/28/2015  1:45 PM EDT ----- Please call in diazepam as updated in the EMR

## 2015-08-29 LAB — TOXASSURE SELECT 13 (MW), URINE

## 2015-08-29 NOTE — Telephone Encounter (Signed)
Rx has been called into pharmacy. KW 

## 2015-08-29 NOTE — Progress Notes (Signed)
Quick Note:  Reviewed. ______ 

## 2015-08-29 NOTE — Telephone Encounter (Signed)
-----   Message from Carmon Ginsberg, [place] sent at 08/28/2015  1:45 PM EDT ----- Please call in diazepam as updated in the EMR

## 2015-09-18 ENCOUNTER — Encounter: Payer: Self-pay | Admitting: Pain Medicine

## 2015-09-18 ENCOUNTER — Ambulatory Visit: Payer: 59 | Attending: Pain Medicine | Admitting: Pain Medicine

## 2015-09-18 VITALS — BP 107/73 | HR 74 | Temp 97.6°F | Resp 16 | Ht 73.0 in | Wt 182.0 lb

## 2015-09-18 DIAGNOSIS — M722 Plantar fascial fibromatosis: Secondary | ICD-10-CM | POA: Insufficient documentation

## 2015-09-18 DIAGNOSIS — G588 Other specified mononeuropathies: Secondary | ICD-10-CM | POA: Diagnosis not present

## 2015-09-18 DIAGNOSIS — Z7901 Long term (current) use of anticoagulants: Secondary | ICD-10-CM | POA: Diagnosis not present

## 2015-09-18 DIAGNOSIS — M79672 Pain in left foot: Secondary | ICD-10-CM | POA: Diagnosis present

## 2015-09-18 DIAGNOSIS — G90523 Complex regional pain syndrome I of lower limb, bilateral: Secondary | ICD-10-CM | POA: Diagnosis not present

## 2015-09-18 MED ORDER — HYDROCODONE-ACETAMINOPHEN 5-325 MG PO TABS: ORAL_TABLET | ORAL | Status: DC

## 2015-09-18 MED ORDER — GABAPENTIN 300 MG PO CAPS: ORAL_CAPSULE | ORAL | Status: DC

## 2015-09-18 NOTE — Progress Notes (Signed)
   Subjective:    Patient ID: Kevin Carpenter, male    DOB: 30-Nov-1955, 60 y.o.   MRN: [id number]  HPI  The patient is a 60 year old gentleman who returns to pain management for further evaluation and treatment of pain involving the lower studies. The patient has had complaint of pain of the feet. At the present time we will avoid interventional treatment. Patient continues anticoagulant therapy. We discussed patient undergoing follow-up evaluation with his podiatrist as well. At the present time we will continue medications consisting of Neurontin and hydrocodone acetaminophen.. We have also considered Cymbalta. At the present time we will observe response to the present treatment and consider modification of medications as well. The patient denied any trauma change in events of daily living the call significant change in symptomatology. All agreed to suggested treatment plan.  Review of Systems     Objective:   Physical Exam   There was tends to palpation of paraspinal misreading cervical region cervical facet region of minimal degree with minimal tenderness over the region of the splenius capitis and occipitalis regions. Palpation of the acromioclavicular and glenohumeral joint regions reproduces minimal discomfort and patient was with unremarkable Spurling's maneuver. The patient appeared to be with bilaterally equal grip strength and Tinel and Phalen's maneuver were without increased pain of significant degree. Palpation over the region of the thoracic region was with no crepitus of the thoracic region noted. Palpation over the lumbar paraspinal musculature region was attends to palpation of moderate degree with lateral bending rotation extension and palpation over the lumbar facets reproducing moderate discomfort. Palpation over the PSIS and PII S region reproduced mild to moderate discomfort. Straight leg raise was tolerates approximately 30 without an increase of pain with dorsiflexion noted. EHL  strength appeared to be equal with no sensory deficit or dermatomal distribution detected. There was mild tenderness to palpation of the feet without definite allodynia noted. No new lesions of the feet were noted. There was negative clonus negative Homans. Abdomen without tenderness to palpation and no costovertebral tenderness noted.     Assessment & Plan:    Plantar fasciitis  Complex regional pain syndrome of the lower extremities  Neuralgia of feet     PLAN   Continue present medications Neurontin and hydrocodone acetaminophen. We will consider Cymbalta as discussed  F/U PCP Dr.Chabon  for evaliation of  BP and general medical  condition  F/U surgical evaluation. Recommend follow-up evaluation with podiatrist Dr.Hyatt, follow-up with Dr. Doran Durand, and other physicians as needed   F/U neurological evaluation. May consider PNCV/EMG studies and other studies pending follow-up evaluations  F/U with chiropractor as needed  May consider radiofrequency rhizolysis or intraspinal procedures pending response to present treatment and F/U evaluation . We will avoid interventional treatment at this time due to Xarelto  as discussed  Patient to call Pain Management Center should patient have concerns prior to scheduled return appointment.

## 2015-09-18 NOTE — Progress Notes (Signed)
Safety precautions to be maintained throughout the outpatient stay will include: orient to surroundings, keep bed in low position, maintain call bell within reach at all times, provide assistance with transfer out of bed and ambulation.  

## 2015-09-18 NOTE — Patient Instructions (Addendum)
PLAN   Continue present medications Neurontin and hydrocodone acetaminophen. We will consider Cymbalta as discussed  F/U PCP Dr.Chabon  for evaliation of  BP and general medical  condition  F/U surgical evaluation. Recommend follow-up evaluation with podiatrist Dr.Hyatt, follow-up with Dr. Doran Durand, and other physicians as needed   F/U neurological evaluation. May consider PNCV/EMG studies and other studies pending follow-up evaluations  F/U with chiropractor as needed  May consider radiofrequency rhizolysis or intraspinal procedures pending response to present treatment and F/U evaluation . We will avoid interventional treatment at this time due to Xarelto  as discussed  Patient to call Pain Management Center should patient have concerns prior to scheduled return appointment.Pain Management Discharge Instructions  General Discharge Instructions :  If you need to reach your doctor call: Monday-Friday 8:00 am - 4:00 pm at [phone] or toll free [phone].  After clinic hours [phone] to have operator reach doctor.  Bring all of your medication bottles to all your appointments in the pain clinic.  To cancel or reschedule your appointment with Pain Management please remember to call 24 hours in advance to avoid a fee.  Refer to the educational materials which you have been given on: General Risks, I had my Procedure. Discharge Instructions, Post Sedation.  Post Procedure Instructions:  The drugs you were given will stay in your system until tomorrow, so for the next 24 hours you should not drive, make any legal decisions or drink any alcoholic beverages.  You may eat anything you prefer, but it is better to start with liquids then soups and crackers, and gradually work up to solid foods.  Please notify your doctor immediately if you have any unusual bleeding, trouble breathing or pain that is not related to your normal pain.  Depending on the type of procedure that was done,  some parts of your body may feel week and/or numb.  This usually clears up by tonight or the next day.  Walk with the use of an assistive device or accompanied by an adult for the 24 hours.  You may use ice on the affected area for the first 24 hours.  Put ice in a Ziploc bag and cover with a towel and place against area 15 minutes on 15 minutes off.  You may switch to heat after 24 hours.

## 2015-10-16 ENCOUNTER — Encounter: Payer: Self-pay | Admitting: Pain Medicine

## 2015-10-16 ENCOUNTER — Ambulatory Visit: Payer: 59 | Attending: Pain Medicine | Admitting: Pain Medicine

## 2015-10-16 VITALS — BP 120/73 | HR 87 | Temp 96.0°F | Resp 16 | Ht 73.0 in | Wt 182.0 lb

## 2015-10-16 DIAGNOSIS — M545 Low back pain: Secondary | ICD-10-CM | POA: Diagnosis present

## 2015-10-16 DIAGNOSIS — Z7901 Long term (current) use of anticoagulants: Secondary | ICD-10-CM | POA: Insufficient documentation

## 2015-10-16 DIAGNOSIS — M722 Plantar fascial fibromatosis: Secondary | ICD-10-CM

## 2015-10-16 DIAGNOSIS — G90523 Complex regional pain syndrome I of lower limb, bilateral: Secondary | ICD-10-CM | POA: Diagnosis not present

## 2015-10-16 DIAGNOSIS — G588 Other specified mononeuropathies: Secondary | ICD-10-CM | POA: Insufficient documentation

## 2015-10-16 DIAGNOSIS — M79606 Pain in leg, unspecified: Secondary | ICD-10-CM | POA: Diagnosis present

## 2015-10-16 MED ORDER — HYDROCODONE-ACETAMINOPHEN 5-325 MG PO TABS: ORAL_TABLET | ORAL | Status: DC

## 2015-10-16 MED ORDER — GABAPENTIN 300 MG PO CAPS: ORAL_CAPSULE | ORAL | Status: DC

## 2015-10-16 MED ORDER — GABAPENTIN 300 MG PO CAPS
ORAL_CAPSULE | ORAL | Status: DC
Start: 2015-10-16 — End: 2015-10-16

## 2015-10-16 NOTE — Progress Notes (Signed)
Safety precautions to be maintained throughout the outpatient stay will include: orient to surroundings, keep bed in low position, maintain call bell within reach at all times, provide assistance with transfer out of bed and ambulation.  

## 2015-10-16 NOTE — Progress Notes (Signed)
   Subjective:    Patient ID: Kevin Carpenter, male    DOB: 10/01/1955, 60 y.o.   MRN: [id number]  HPI  The patient is a 60 year old gentleman who returns to pain management for further evaluation and treatment of pain involving the lower back lower extremity region especially the feet. The patient is undergone prior evaluation and treatment by Dr.Hyatt and at the present time is without plans for additional interventional treatment by Dr.Hyatt . With the patient states that he is burning stinging sensation of the feet. We discussed patient's condition and we will continue hydrocodone acetaminophen and Neurontin and will increase Neurontin gradually to evaluate effectiveness. We have also discussed Cymbalta which patient prefers to avoid at this time. We will avoid interventional treatment since patient continues anticoagulant therapy and we preferred to avoid interrupting patient's anticoagulant therapy. The patient will call should he have any undesirable side effects Gesic present medications are other concerns. All agreed to suggested treatment plan    Review of Systems     Objective:   Physical Exam   There was tenderness of the splenius capitis and occipitalis region palpation which reproduces pain of minimal degree with moderate tenderness of the acromioclavicular and glenohumeral joint region and with decreased range of motion of the acromioclavicular and glenohumeral joint region. The patient was with bilaterally equal grip strength without increased pain with Tinel and Phalen's maneuver. Palpation of the thoracic region was without crepitus. Palpation of the lumbar region was with mild tenderness to palpation with lateral bending rotation extension and palpation of the lumbar facets reproducing mild discomfort. Straight leg raising was tolerated to 30 without increased pain with dorsiflexion noted. No new lesions of the feet were noted and patient was with tenderness to palpation of the feet  without definite allodynia. There were no areas of significantly decreased sensation of the lower extremities. The knees were with tenderness to palpation of minimal degree with negative anterior and posterior drawer signs without ballottement of the patella. EHL strength appeared to be equal. There was negative clonus negative Homans. Abdomen nontender with no costovertebral tenderness      Assessment & Plan:      Plantar fasciitis  Complex regional pain syndrome of the lower extremities  Neuralgia of feet       PLAN   Continue present medications Neurontin and hydrocodone acetaminophen. We will consider Cymbalta as discussed  F/U PCP Dr.Chabon  for evaliation of  BP and general medical  condition  F/U surgical evaluation. Recommend follow-up evaluation with podiatrist Dr.Hyatt, follow-up with Dr. Doran Durand, and other physicians as needed  F/U Dr.Supple for evaluation of shoulder   F/U neurological evaluation. May consider PNCV/EMG studies and other studies pending follow-up evaluations  F/U with chiropractor as needed  May consider radiofrequency rhizolysis or intraspinal procedures pending response to present treatment and F/U evaluation . We will avoid interventional treatment at this time due to Xarelto  as discussed  Patient to call Pain Management Center should patient have concerns prior to scheduled return appointment

## 2015-10-16 NOTE — Patient Instructions (Signed)
PLAN   Continue present medications Neurontin and hydrocodone acetaminophen. We will consider Cymbalta as discussed  F/U PCP Dr.Chabon  for evaliation of  BP and general medical  condition  F/U surgical evaluation. Recommend follow-up evaluation with podiatrist Dr.Hyatt, follow-up with Dr. Doran Durand, and other physicians as needed   F/U neurological evaluation. May consider PNCV/EMG studies and other studies pending follow-up evaluations  F/U with chiropractor as needed  May consider radiofrequency rhizolysis or intraspinal procedures pending response to present treatment and F/U evaluation . We will avoid interventional treatment at this time due to Xarelto  as discussed  Patient to call Pain Management Center should patient have concerns prior to scheduled return appointment

## 2015-11-13 ENCOUNTER — Encounter: Payer: Self-pay | Admitting: Pain Medicine

## 2015-11-13 ENCOUNTER — Ambulatory Visit: Payer: 59 | Attending: Pain Medicine | Admitting: Pain Medicine

## 2015-11-13 VITALS — BP 131/83 | HR 75 | Temp 97.6°F | Resp 15 | Ht 73.0 in | Wt 183.0 lb

## 2015-11-13 DIAGNOSIS — M792 Neuralgia and neuritis, unspecified: Secondary | ICD-10-CM | POA: Diagnosis not present

## 2015-11-13 DIAGNOSIS — I7121 Aneurysm of the ascending aorta, without rupture: Secondary | ICD-10-CM

## 2015-11-13 DIAGNOSIS — M722 Plantar fascial fibromatosis: Secondary | ICD-10-CM | POA: Diagnosis not present

## 2015-11-13 DIAGNOSIS — G90523 Complex regional pain syndrome I of lower limb, bilateral: Secondary | ICD-10-CM | POA: Diagnosis not present

## 2015-11-13 DIAGNOSIS — M545 Low back pain: Secondary | ICD-10-CM | POA: Diagnosis present

## 2015-11-13 DIAGNOSIS — I719 Aortic aneurysm of unspecified site, without rupture: Secondary | ICD-10-CM

## 2015-11-13 MED ORDER — HYDROCODONE-ACETAMINOPHEN 5-325 MG PO TABS: ORAL_TABLET | ORAL | 0 refills | Status: DC

## 2015-11-13 MED ORDER — DULOXETINE HCL 20 MG PO CPEP: ORAL_CAPSULE | ORAL | 0 refills | Status: DC

## 2015-11-13 MED ORDER — GABAPENTIN 300 MG PO CAPS: ORAL_CAPSULE | ORAL | 0 refills | Status: DC

## 2015-11-13 NOTE — Care Management Note (Signed)
Safety precautions to be maintained throughout the outpatient stay will include: orient to surroundings, keep bed in low position, maintain call bell within reach at all times, provide assistance with transfer out of bed and ambulation.  

## 2015-11-13 NOTE — Progress Notes (Signed)
    The patient is a 60 year old gentleman who returns to pain management for further evaluation and treatment of pain involving the lumbar lower extremity regions. The patient has had some improvement of his condition with treatment in pain management and at the present time is tolerating hydrocodone acetaminophen and Neurontin very well. We discussed patient's condition and decision has been made to increase Neurontin by one more pill per day if tolerated and to begin Cymbalta. The patient denies any drowsiness confusion or other undesirable side effects at this time. We will observe response to present modification of treatment regimen and we'll consider additional modifications of treatment pending response to treatment and follow-up evaluation. All agreed to suggested treatment plan. The patient denied any trauma change in events of daily living the call significant change in symptoms pathology.     Physical examination  There was mild tenderness of the splenius capitis and occipitalis region a mild tenderness over the cervical and thoracic facet paraspinal muscular region. Palpation over the region of the acromioclavicular and glenohumeral joint regions reproduce mild discomfort and patient was able to perform drop test without significant difficulty. Patient appeared to be with bilaterally equal grip strength without significant increase of pain with Tinel and Phalen's maneuver. Palpation over the thoracic region was with no crepitus of the thoracic region. Palpation over the lumbar paraspinal must reason lumbar facet region was with moderate tenderness to palpation with lateral bending rotation extension and palpation of the lumbar facets reproducing moderate discomfort. Straight leg raising appeared to be tolerates approximately 30 without a definite increase of pain with dorsiflexion noted. EHL strength appeared to be slightly decreased. The feet were with tenderness to palpation without definite  allodynia noted no new lesions of the feet were noted. There was negative clonus negative Homans. Abdomen nontender with no costovertebral tenderness noted.     Assessment    Plantar fasciitis  Complex regional pain syndrome of the lower extremities  Neuralgia of feet      PLAN   Continue present medications Neurontin and hydrocodone acetaminophen. BEGIN  Cymbalta CAUTION   Medications can cause respiratory depression and cause you to stop breathing, cause excessive sedation, cause confusion and other side effects.  Exercise extreme caution when taking medication and call EMS or go to the Emergency Department immediately if you develop any of these symptoms   F/U PCP Dr.Chabon  for evaliation of  BP and general medical  condition  F/U surgical evaluation. Recommend follow-up evaluation with podiatrist Dr.Hyatt, follow-up with Dr. Doran Durand, and other physicians as needed   F/U  Supple as needed  F/U neurological evaluation. May consider PNCV/EMG studies and other studies pending follow-up evaluations  F/U with chiropractor as needed  May consider radiofrequency rhizolysis or intraspinal procedures pending response to present treatment and F/U evaluation . We will avoid interventional treatment at this time due to Xarelto  as discussed  Patient to call Pain Management Center should patient have concerns prior to scheduled return appointment

## 2015-11-13 NOTE — Patient Instructions (Addendum)
PLAN   Continue present medications Neurontin and hydrocodone acetaminophen. BEGIN  Cymbalta CAUTION   Medications can cause respiratory depression and cause you to stop breathing, cause excessive sedation, cause confusion and other side effects.  Exercise extreme caution when taking medication and call EMS or go to the Emergency Department immediately if you develop any of these symptoms   F/U PCP Dr.Chabon  for evaliation of  BP and general medical  condition  F/U surgical evaluation. Recommend follow-up evaluation with podiatrist Dr.Hyatt, follow-up with Dr. Doran Durand, and other physicians as needed   F/U  Supple as needed  F/U neurological evaluation. May consider PNCV/EMG studies and other studies pending follow-up evaluations  F/U with chiropractor as needed  May consider radiofrequency rhizolysis or intraspinal procedures pending response to present treatment and F/U evaluation . We will avoid interventional treatment at this time due to Xarelto  as discussed  Patient to call Pain Management Center should patient have concerns prior to scheduled return appointmentPain Management Discharge Instructions  General Discharge Instructions :  If you need to reach your doctor call: Monday-Friday 8:00 am - 4:00 pm at [phone] or toll free [phone].  After clinic hours [phone] to have operator reach doctor.  Bring all of your medication bottles to all your appointments in the pain clinic.  To cancel or reschedule your appointment with Pain Management please remember to call 24 hours in advance to avoid a fee.  Refer to the educational materials which you have been given on: General Risks, I had my Procedure. Discharge Instructions, Post Sedation.  Post Procedure Instructions:  The drugs you were given will stay in your system until tomorrow, so for the next 24 hours you should not drive, make any legal decisions or drink any alcoholic beverages.  You may eat anything you  prefer, but it is better to start with liquids then soups and crackers, and gradually work up to solid foods.  Please notify your doctor immediately if you have any unusual bleeding, trouble breathing or pain that is not related to your normal pain.  Depending on the type of procedure that was done, some parts of your body may feel week and/or numb.  This usually clears up by tonight or the next day.  Walk with the use of an assistive device or accompanied by an adult for the 24 hours.  You may use ice on the affected area for the first 24 hours.  Put ice in a Ziploc bag and cover with a towel and place against area 15 minutes on 15 minutes off.  You may switch to heat after 24 hours.

## 2015-11-28 ENCOUNTER — Telehealth: Payer: Self-pay | Admitting: *Deleted

## 2015-12-10 ENCOUNTER — Ambulatory Visit: Payer: 59 | Attending: Pain Medicine | Admitting: Pain Medicine

## 2015-12-10 ENCOUNTER — Encounter: Payer: Self-pay | Admitting: Pain Medicine

## 2015-12-10 VITALS — BP 128/79 | HR 66 | Temp 97.8°F | Resp 16 | Ht 73.0 in | Wt 183.0 lb

## 2015-12-10 DIAGNOSIS — G588 Other specified mononeuropathies: Secondary | ICD-10-CM | POA: Diagnosis not present

## 2015-12-10 DIAGNOSIS — M722 Plantar fascial fibromatosis: Secondary | ICD-10-CM

## 2015-12-10 DIAGNOSIS — M79606 Pain in leg, unspecified: Secondary | ICD-10-CM | POA: Diagnosis present

## 2015-12-10 DIAGNOSIS — G90523 Complex regional pain syndrome I of lower limb, bilateral: Secondary | ICD-10-CM | POA: Diagnosis not present

## 2015-12-10 MED ORDER — GABAPENTIN 300 MG PO CAPS: ORAL_CAPSULE | ORAL | 0 refills | Status: DC

## 2015-12-10 MED ORDER — HYDROCODONE-ACETAMINOPHEN 5-325 MG PO TABS: ORAL_TABLET | ORAL | 0 refills | Status: DC

## 2015-12-10 MED ORDER — DULOXETINE HCL 20 MG PO CPEP: ORAL_CAPSULE | ORAL | 0 refills | Status: DC

## 2015-12-10 NOTE — Patient Instructions (Addendum)
PLAN   Continue present medications Neurontin and hydrocodone acetaminophen. NO CYMBALTA since she was unable to tolerate Cymbalta CAUTION   Medications can cause respiratory depression and cause you to stop breathing, cause excessive sedation, cause confusion and other side effects.  Exercise extreme caution when taking medication and call EMS or go to the Emergency Department immediately if you develop any of these symptoms   F/U PCP Dr.Chabon  for evaliation of  BP and general medical  condition  F/U surgical evaluation. Recommend follow-up evaluation with podiatrist Dr.Hyatt, follow-up with Dr. Doran Durand, and other physicians as needed   F/U  Dr. Onnie Graham as needed  F/U neurological evaluation. May consider PNCV/EMG studies and other studies pending follow-up evaluations  F/U with chiropractor as needed  May consider radiofrequency rhizolysis or intraspinal procedures pending response to present treatment and F/U evaluation . We will avoid interventional treatment at this time due to Xarelto  as discussed  Patient to call Pain Management Center should patient have concerns prior to scheduled return appointment

## 2015-12-10 NOTE — Progress Notes (Signed)
Safety precautions to be maintained throughout the outpatient stay will include: orient to surroundings, keep bed in low position, maintain call bell within reach at all times, provide assistance with transfer out of bed and ambulation.  

## 2015-12-11 ENCOUNTER — Ambulatory Visit: Payer: 59 | Admitting: Pain Medicine

## 2015-12-11 NOTE — Progress Notes (Signed)
     The patient is a 60 year old gentleman who returns to pain management for further evaluation and treatment of pain involving the lower extremity region especially the feet. Patient states that the present time he is with pain fairly well controlled. The patient was unable to tolerate Cymbalta and Cymbalta has been discontinued. We will continue present medications of Neurontin and hydrocodone acetaminophen and we will remain available to consider modifications of treatment regimen pending response to the present treatment. The patient was with understanding and agreement suggested treatment plan. The patient is working in denied trauma change in events of daily living the call significant change in symptomatology.     Physical examination  There was tenderness of the cervical facet cervical paraspinal musculature region and splenius capitis and occipitalis region a mild degree with mild tenderness of the acromioclavicular and glenohumeral joint region. Palpation over the thoracic region was without crepitus of the thoracic region. Patient appeared to be with bilaterally equal grip strength. Tinel and Phalen's maneuver without increased pain of significant degree. Palpation over the lumbar paraspinal musculature region lumbar facet region was with moderate tenderness to palpation with lateral bending rotation extension and palpation of the lumbar facets reproducing mild to moderate discomfort. The feet were with tenderness to palpation without definite allodynia noted. No increased warmth erythema of the feet noted. There appeared to be no sensory deficit or dermatomal dystrophy detected. Palpation over the PSIS and PII S region reproduced mild discomfort and patient was with tenderness to palpation of the greater trochanteric region iliotibial band region. There was negative clonus negative Homans Abdomen was nontender with no costovertebral tenderness noted     Assessment  Plantar  fasciitis  Complex regional pain syndrome of the lower extremities  Neuralgia of feet      PLAN   Continue present medications Neurontin and hydrocodone acetaminophen. NO CYMBALTA since she was unable to tolerate Cymbalta CAUTION   Medications can cause respiratory depression and cause you to stop breathing, cause excessive sedation, cause confusion and other side effects.  Exercise extreme caution when taking medication and call EMS or go to the Emergency Department immediately if you develop any of these symptoms   F/U PCP Dr.Chabon  for evaliation of  BP and general medical  condition  F/U surgical evaluation. Recommend follow-up evaluation with podiatrist Dr.Hyatt, follow-up with Dr. Doran Durand, and other physicians as needed   F/U  Dr. Onnie Graham as needed  F/U neurological evaluation. May consider PNCV/EMG studies and other studies pending follow-up evaluations  F/U with chiropractor as needed  May consider radiofrequency rhizolysis or intraspinal procedures pending response to present treatment and F/U evaluation . We will avoid interventional treatment at this time due to Xarelto  as discussed  Patient to call Pain Management Center should patient have concerns prior to scheduled return appointment

## 2016-02-11 ENCOUNTER — Telehealth: Payer: Self-pay | Admitting: Family Medicine

## 2016-02-11 NOTE — Telephone Encounter (Signed)
Patient's wife Theophilus Kinds (which is on the [facility]) calling and requesting and giving [place] verbal order to fax the patient's labs to [employer] @ [phone] Phone # [phone]. Thanks CC

## 2016-02-14 ENCOUNTER — Other Ambulatory Visit: Payer: Self-pay | Admitting: Pain Medicine

## 2016-04-01 DIAGNOSIS — M25571 Pain in right ankle and joints of right foot: Secondary | ICD-10-CM | POA: Diagnosis not present

## 2016-04-01 DIAGNOSIS — M25572 Pain in left ankle and joints of left foot: Secondary | ICD-10-CM | POA: Diagnosis not present

## 2016-04-01 DIAGNOSIS — M2141 Flat foot [pes planus] (acquired), right foot: Secondary | ICD-10-CM | POA: Diagnosis not present

## 2016-04-03 DIAGNOSIS — M25572 Pain in left ankle and joints of left foot: Secondary | ICD-10-CM | POA: Diagnosis not present

## 2016-04-03 DIAGNOSIS — M25571 Pain in right ankle and joints of right foot: Secondary | ICD-10-CM | POA: Diagnosis not present

## 2016-04-03 DIAGNOSIS — M2141 Flat foot [pes planus] (acquired), right foot: Secondary | ICD-10-CM | POA: Diagnosis not present

## 2016-04-07 DIAGNOSIS — M25572 Pain in left ankle and joints of left foot: Secondary | ICD-10-CM | POA: Diagnosis not present

## 2016-04-07 DIAGNOSIS — M2141 Flat foot [pes planus] (acquired), right foot: Secondary | ICD-10-CM | POA: Diagnosis not present

## 2016-04-07 DIAGNOSIS — M25571 Pain in right ankle and joints of right foot: Secondary | ICD-10-CM | POA: Diagnosis not present

## 2016-04-11 DIAGNOSIS — J340 Abscess, furuncle and carbuncle of nose: Secondary | ICD-10-CM | POA: Diagnosis not present

## 2016-04-11 DIAGNOSIS — R040 Epistaxis: Secondary | ICD-10-CM | POA: Diagnosis not present

## 2016-04-21 DIAGNOSIS — R001 Bradycardia, unspecified: Secondary | ICD-10-CM | POA: Diagnosis not present

## 2016-04-21 DIAGNOSIS — I82512 Chronic embolism and thrombosis of left femoral vein: Secondary | ICD-10-CM | POA: Diagnosis not present

## 2016-04-21 DIAGNOSIS — I719 Aortic aneurysm of unspecified site, without rupture: Secondary | ICD-10-CM | POA: Diagnosis not present

## 2016-04-29 ENCOUNTER — Other Ambulatory Visit: Payer: Self-pay | Admitting: Pain Medicine

## 2016-04-29 DIAGNOSIS — M722 Plantar fascial fibromatosis: Secondary | ICD-10-CM | POA: Diagnosis not present

## 2016-04-29 DIAGNOSIS — G90529 Complex regional pain syndrome I of unspecified lower limb: Secondary | ICD-10-CM | POA: Diagnosis not present

## 2016-05-06 ENCOUNTER — Other Ambulatory Visit: Payer: Self-pay | Admitting: Family Medicine

## 2016-05-06 DIAGNOSIS — F419 Anxiety disorder, unspecified: Secondary | ICD-10-CM

## 2016-05-06 NOTE — Telephone Encounter (Signed)
Called in. Kevin Carpenter, CMA  

## 2016-05-10 IMAGING — CT CT ANGIO CHEST
1 of 2 series · 18 of 32 positions shown · IV contrast (APPLIED)
Comparison: 02/06/2014

CLINICAL DATA: Shortness of breath for 2 weeks

EXAM:
CT ANGIOGRAPHY CHEST WITH CONTRAST
TECHNIQUE: Multidetector CT imaging of the chest was performed using the
standard protocol during bolus administration of intravenous
contrast. Multiplanar CT image reconstructions and MIPs were
obtained to evaluate the vascular anatomy.
CONTRAST:  100mL OMNIPAQUE IOHEXOL 350 MG/ML SOLN

[Series 6: cta chest · axial · 0.80mm/px · z∈[-415,-79]mm · 18 of 186 slices shown]
[im 9/186  lung]
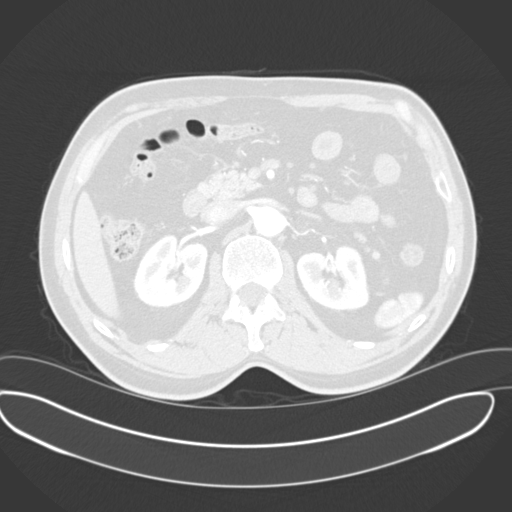
[im 17/186  soft-tissue]
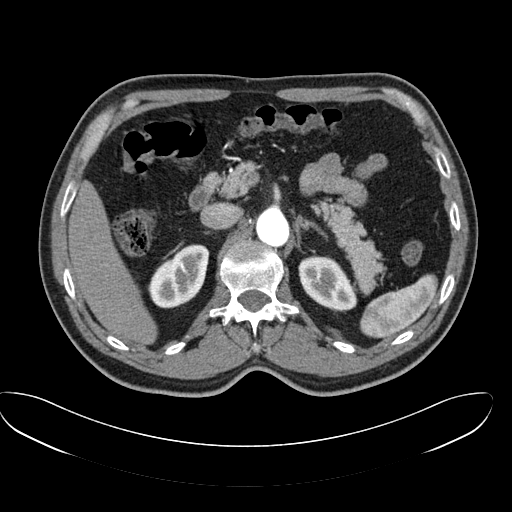
[im 26/186  lung]
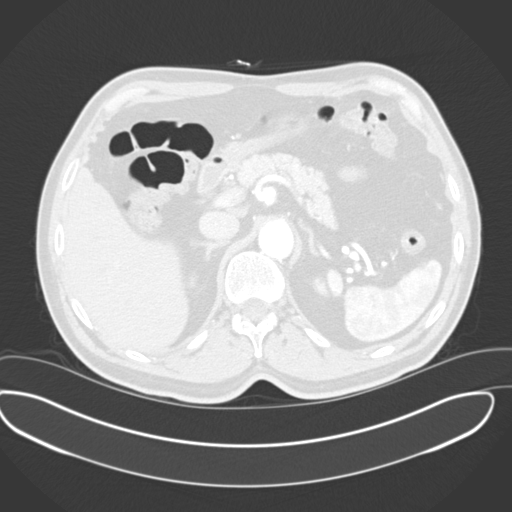
[im 43/186  soft-tissue]
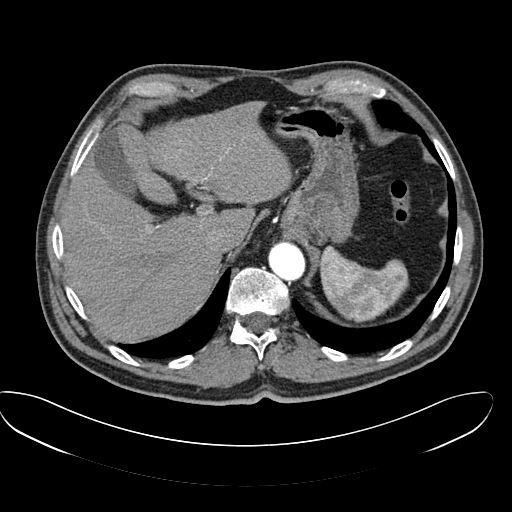
[im 51/186  lung]
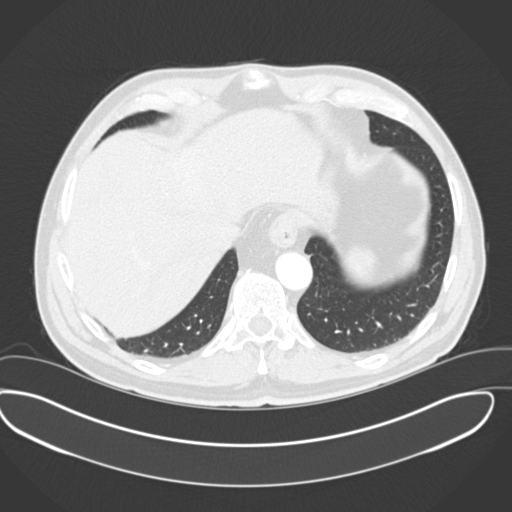
[im 59/186  soft-tissue]
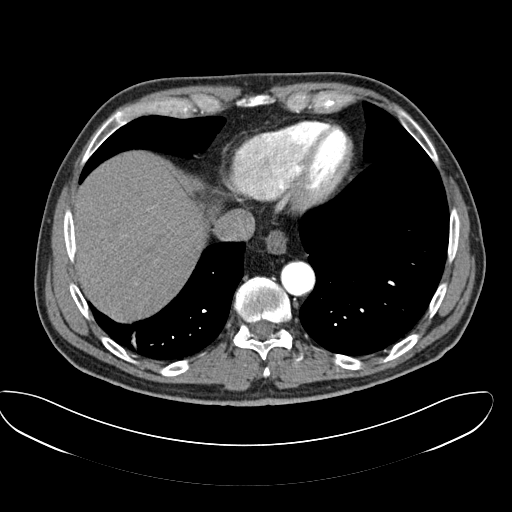
[im 68/186  lung]
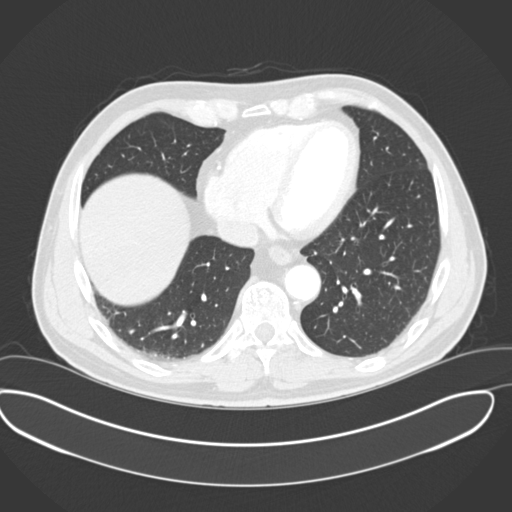
[im 76/186  soft-tissue]
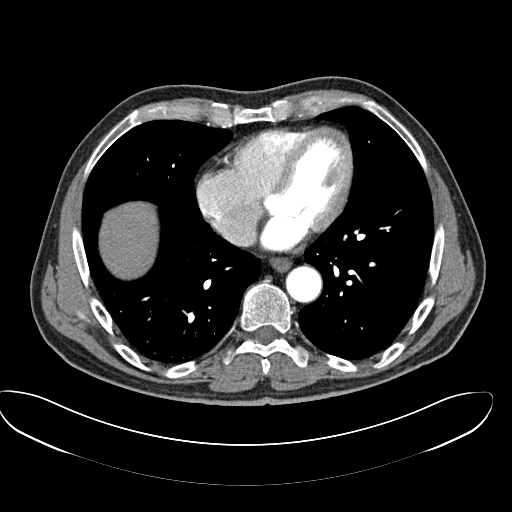
[im 85/186  lung]
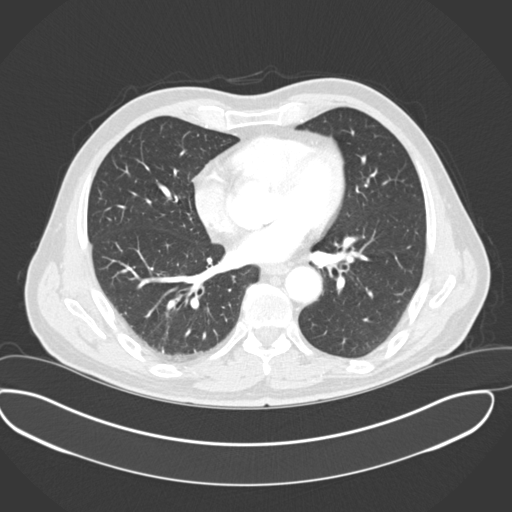
[im 101/186  soft-tissue]
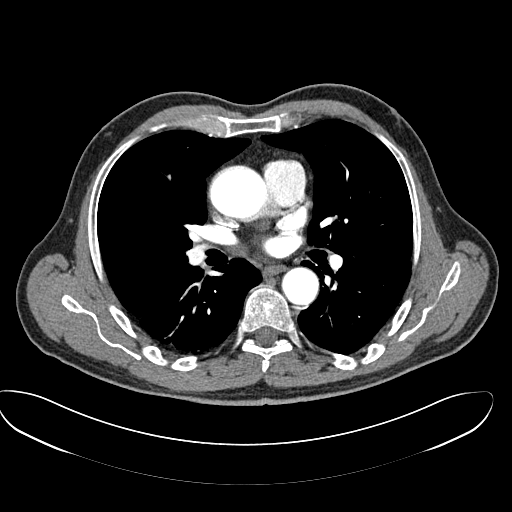
[im 110/186  lung]
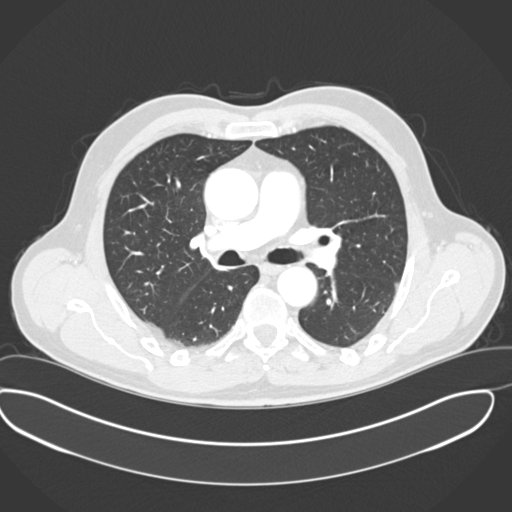
[im 118/186  soft-tissue]
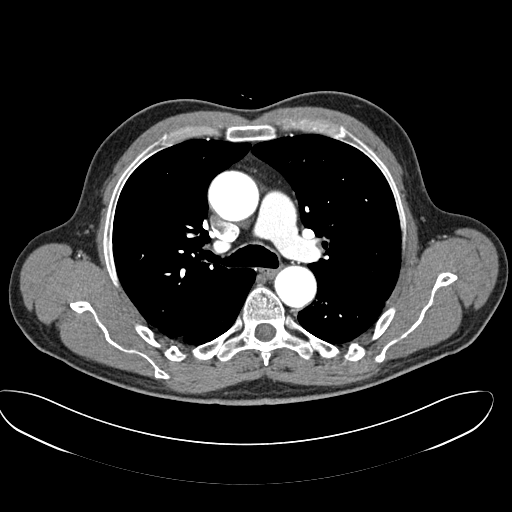
[im 127/186  lung]
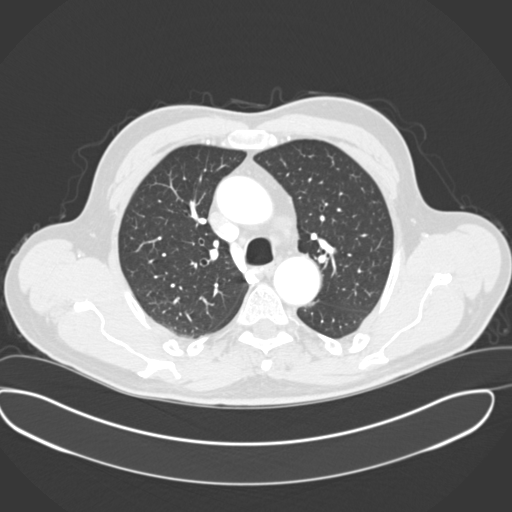
[im 135/186  soft-tissue]
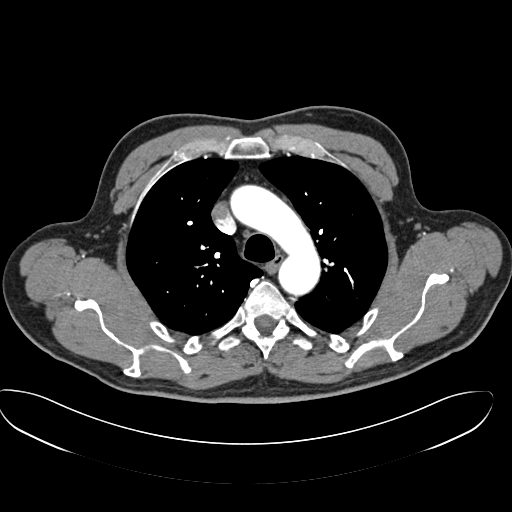
[im 143/186  lung]
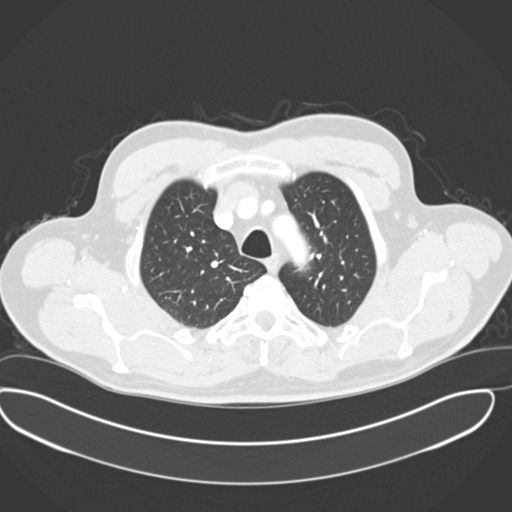
[im 160/186  soft-tissue]
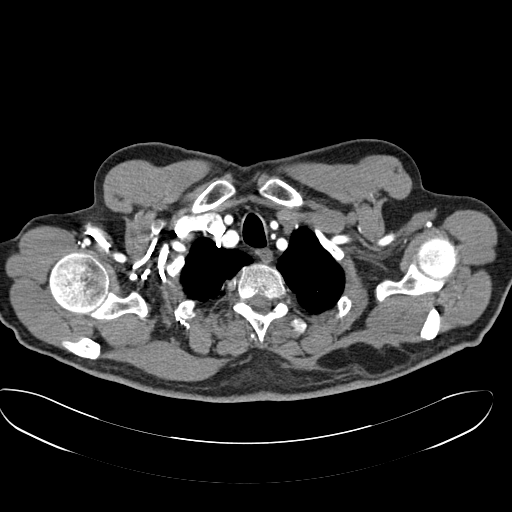
[im 169/186  lung]
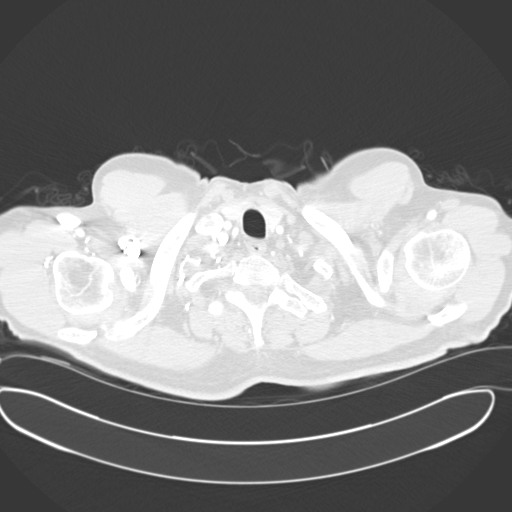
[im 177/186  soft-tissue]
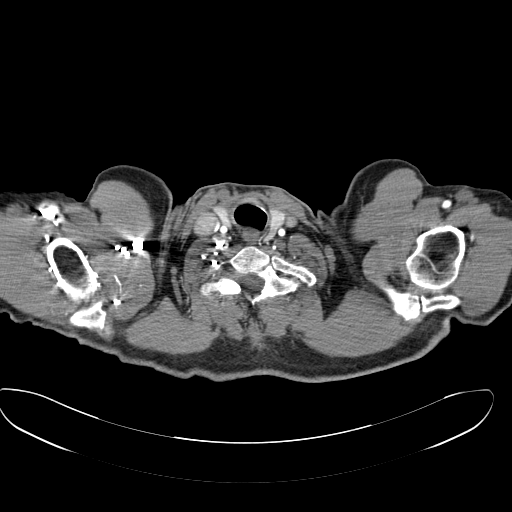

[18 of 32 positions shown; findings below may reference images not displayed]

FINDINGS: Lungs are well aerated bilaterally without focal infiltrate or
sizable effusion. A tiny 2-3 mm nodule is noted in the upper pole of
the right lung stable from the prior exam. This is best seen on
image number 27 of series 3. Given the stability is likely benign in
etiology. No other nodules are seen. No focal infiltrate is noted.
Minimal dependent atelectatic changes are seen.

The thoracic inlet is within normal limits. The thoracic aorta is
mildly dilated in its ascending component. At the level of the
pulmonary valve the measures approximately 4.3 mm cm. This is
slightly enlarged from the prior exam at which time it measured
cm. The pulmonary artery is well visualized and the previously seen
large embolus has resolved in the interval. No filling defects to
suggest pulmonary emboli are noted. No hilar or mediastinal
adenopathy is seen. Visualized upper abdomen is within normal limits
with the exception of the distal aspect of the celiac axis.
Dilatation is again seen which appears stable when compared with the
prior exam. Mild intimal irregularity is noted suggesting that this
may represent a pseudoaneurysm. No obstructive changes are seen. No
acute bony abnormality is noted.

Review of the MIP images confirms the above findings.
IMPRESSION: Mild aneurysmal dilatation of the ascending thoracic aorta. This is
increased slightly in the interval from the prior exam (2 mm)
Recommend annual imaging followup by CTA or MRA. This recommendation
follows 4222 ACCF/AHA/AATS/ACR/ASA/SCA/ADIN/DAMJAN/ANNET/TIGER Guidelines
for the Diagnosis and Management of Patients with Thoracic Aortic
Disease. Circulation. 4222; 121: [id number]

No evidence of pulmonary emboli. The previously seen emboli have
resolved in the interval.

Stable right upper lobe pulmonary nodule likely benign etiology
given its size instability.

Stable dilatation of the distal celiac axis. Mild area of intimal
irregularity is noted suggestive of an area of
dissection/pseudoaneurysm. This is also stable in appearance.

Critical Value/emergent results were called by telephone at the time
of interpretation on 03/27/2015 at [DATE] to Chukzy at [REDACTED], who verbally acknowledged these results.

## 2016-05-12 DIAGNOSIS — J0101 Acute recurrent maxillary sinusitis: Secondary | ICD-10-CM | POA: Diagnosis not present

## 2016-05-27 DIAGNOSIS — M722 Plantar fascial fibromatosis: Secondary | ICD-10-CM | POA: Diagnosis not present

## 2016-05-27 DIAGNOSIS — G894 Chronic pain syndrome: Secondary | ICD-10-CM | POA: Diagnosis not present

## 2016-05-27 DIAGNOSIS — G90529 Complex regional pain syndrome I of unspecified lower limb: Secondary | ICD-10-CM | POA: Diagnosis not present

## 2016-05-27 DIAGNOSIS — Z79891 Long term (current) use of opiate analgesic: Secondary | ICD-10-CM | POA: Diagnosis not present

## 2016-06-20 ENCOUNTER — Encounter: Payer: Self-pay | Admitting: Family Medicine

## 2016-06-20 ENCOUNTER — Ambulatory Visit (INDEPENDENT_AMBULATORY_CARE_PROVIDER_SITE_OTHER): Payer: 59 | Admitting: Family Medicine

## 2016-06-20 VITALS — BP 110/74 | HR 73 | Temp 97.9°F | Resp 16 | Ht 71.5 in | Wt 193.8 lb

## 2016-06-20 DIAGNOSIS — F411 Generalized anxiety disorder: Secondary | ICD-10-CM

## 2016-06-20 DIAGNOSIS — E782 Mixed hyperlipidemia: Secondary | ICD-10-CM

## 2016-06-20 DIAGNOSIS — J301 Allergic rhinitis due to pollen: Secondary | ICD-10-CM

## 2016-06-20 DIAGNOSIS — Z0000 Encounter for general adult medical examination without abnormal findings: Secondary | ICD-10-CM

## 2016-06-20 DIAGNOSIS — J309 Allergic rhinitis, unspecified: Secondary | ICD-10-CM | POA: Insufficient documentation

## 2016-06-20 DIAGNOSIS — Z86711 Personal history of pulmonary embolism: Secondary | ICD-10-CM | POA: Diagnosis not present

## 2016-06-20 DIAGNOSIS — M722 Plantar fascial fibromatosis: Secondary | ICD-10-CM

## 2016-06-20 DIAGNOSIS — N400 Enlarged prostate without lower urinary tract symptoms: Secondary | ICD-10-CM

## 2016-06-20 NOTE — Progress Notes (Signed)
Subjective:     Patient ID: Kevin Carpenter, male   DOB: [birth year], 61 y.o.   MRN: [id number]  HPI  Chief Complaint  Patient presents with  . Annual Exam    Patient comes in office today for his annual physical he stats that he is feeling well today. Patient reports that he is working of following a balanced diet, he is not currently exercising do to plantar facsiitis, patient is sleeping on average 7-8hrs a night and reports that his Libido is normal. Patient is due for colonoscopy and screening labs today, he has declined flu vaccine. 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 Additional physicians include Dr. Nehemiah Massed, cardiology; Dr. Primus Bravo, pain center, Dr. Pearline Cables, chiropractor, and Dr. Amalia Hailey, urology.   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 Recent f/u by cardiology. GI: no heartburn, no change in bowel habits or blood in the stool. He is overdue now for 5 year colonoscopy f/u for colon polyps. GU: nocturia x 0, no change in bladder habits. Annual PSA per urology for BPH. Psychiatric: not depressed, rarely will have a panic attack and use 1/2 diazepam. Musculoskeletal: Followed by the pain clinic and a sports medicine chiropractor for plantar fasciitis.    Objective:   Physical Exam  Constitutional: He appears well-developed and well-nourished. No distress.  Eyes: PERRLA, EOMI Neck: no thyromegaly, tenderness or nodules, no carotid bruits or cervical adenopathy ENT: TM's intact without inflammation; tonsils absent Lungs: Clear Heart : RRR without murmur or gallop Abd: bowel sounds present, soft, non-tender, no organomegaly Extremities: no edema Skin: no atypical lesions  noted on his back     Assessment:    1. Annual physical exam - Comprehensive metabolic panel  2. Mixed hyperlipidemia - Lipid panel  3. Hx of pulmonary embolus: Xarelto  4. Generalized anxiety disorder: diazepam prn  5. Plantar fasciitis, bilateral: per consultants  6. Chronic seasonal allergic rhinitis due to pollen  7. Benign nodular prostatic hyperplasia without lower urinary tract symptoms: per urology    Plan:    Further f/u pending lab work.

## 2016-06-20 NOTE — Patient Instructions (Signed)
We will call you with the lab results. Let me know when you want [place] to set up the colonoscopy.

## 2016-06-23 DIAGNOSIS — Z79891 Long term (current) use of opiate analgesic: Secondary | ICD-10-CM | POA: Diagnosis not present

## 2016-06-23 DIAGNOSIS — G90529 Complex regional pain syndrome I of unspecified lower limb: Secondary | ICD-10-CM | POA: Diagnosis not present

## 2016-06-23 DIAGNOSIS — M722 Plantar fascial fibromatosis: Secondary | ICD-10-CM | POA: Diagnosis not present

## 2016-06-23 DIAGNOSIS — M79605 Pain in left leg: Secondary | ICD-10-CM | POA: Diagnosis not present

## 2016-06-23 DIAGNOSIS — M79672 Pain in left foot: Secondary | ICD-10-CM | POA: Diagnosis not present

## 2016-06-24 DIAGNOSIS — Z0000 Encounter for general adult medical examination without abnormal findings: Secondary | ICD-10-CM | POA: Diagnosis not present

## 2016-06-25 LAB — LIPID PANEL
CHOL/HDL RATIO: 4.6 ratio (ref 0.0–5.0)
Cholesterol, Total: 222 mg/dL — ABNORMAL HIGH (ref 100–199)
HDL: 48 mg/dL (ref >39)
LDL Calculated: 138 mg/dL — ABNORMAL HIGH (ref 0–99)
Triglycerides: 181 mg/dL — ABNORMAL HIGH (ref 0–149)
VLDL CHOLESTEROL CAL: 36 mg/dL (ref 5–40)

## 2016-06-25 LAB — COMPREHENSIVE METABOLIC PANEL
A/G RATIO: 2.1 (ref 1.2–2.2)
ALBUMIN: 4.5 g/dL (ref 3.6–4.8)
ALT: 14 IU/L (ref 0–44)
AST: 19 IU/L (ref 0–40)
Alkaline Phosphatase: 76 IU/L (ref 39–117)
BILIRUBIN TOTAL: 0.9 mg/dL (ref 0.0–1.2)
BUN / CREAT RATIO: 12 (ref 10–24)
BUN: 12 mg/dL (ref 8–27)
CALCIUM: 9.5 mg/dL (ref 8.6–10.2)
CHLORIDE: 101 mmol/L (ref 96–106)
CO2: 24 mmol/L (ref 18–29)
Creatinine, Ser: 1 mg/dL (ref 0.76–1.27)
GFR, EST AFRICAN AMERICAN: 94 mL/min/{1.73_m2} (ref >59)
GFR, EST NON AFRICAN AMERICAN: 81 mL/min/{1.73_m2} (ref >59)
GLOBULIN, TOTAL: 2.1 g/dL (ref 1.5–4.5)
Glucose: 99 mg/dL (ref 65–99)
POTASSIUM: 4.7 mmol/L (ref 3.5–5.2)
Sodium: 140 mmol/L (ref 134–144)
TOTAL PROTEIN: 6.6 g/dL (ref 6.0–8.5)

## 2016-06-25 NOTE — Progress Notes (Signed)
Patient advised and wishes to work on habits now that summer is almost here. ED

## 2016-07-21 DIAGNOSIS — Z79891 Long term (current) use of opiate analgesic: Secondary | ICD-10-CM | POA: Diagnosis not present

## 2016-07-21 DIAGNOSIS — M79605 Pain in left leg: Secondary | ICD-10-CM | POA: Diagnosis not present

## 2016-07-21 DIAGNOSIS — G90529 Complex regional pain syndrome I of unspecified lower limb: Secondary | ICD-10-CM | POA: Diagnosis not present

## 2016-07-21 DIAGNOSIS — M79672 Pain in left foot: Secondary | ICD-10-CM | POA: Diagnosis not present

## 2016-07-21 DIAGNOSIS — M722 Plantar fascial fibromatosis: Secondary | ICD-10-CM | POA: Diagnosis not present

## 2016-07-22 DIAGNOSIS — I719 Aortic aneurysm of unspecified site, without rupture: Secondary | ICD-10-CM | POA: Diagnosis not present

## 2016-08-19 DIAGNOSIS — G90529 Complex regional pain syndrome I of unspecified lower limb: Secondary | ICD-10-CM | POA: Diagnosis not present

## 2016-08-19 DIAGNOSIS — M79605 Pain in left leg: Secondary | ICD-10-CM | POA: Diagnosis not present

## 2016-08-19 DIAGNOSIS — M722 Plantar fascial fibromatosis: Secondary | ICD-10-CM | POA: Diagnosis not present

## 2016-08-19 DIAGNOSIS — Z79891 Long term (current) use of opiate analgesic: Secondary | ICD-10-CM | POA: Diagnosis not present

## 2016-08-19 DIAGNOSIS — M79672 Pain in left foot: Secondary | ICD-10-CM | POA: Diagnosis not present

## 2016-08-26 DIAGNOSIS — I719 Aortic aneurysm of unspecified site, without rupture: Secondary | ICD-10-CM | POA: Diagnosis not present

## 2016-08-26 DIAGNOSIS — E782 Mixed hyperlipidemia: Secondary | ICD-10-CM | POA: Diagnosis not present

## 2016-08-26 DIAGNOSIS — I82512 Chronic embolism and thrombosis of left femoral vein: Secondary | ICD-10-CM | POA: Diagnosis not present

## 2016-09-17 ENCOUNTER — Other Ambulatory Visit: Payer: Self-pay | Admitting: Family Medicine

## 2016-09-17 DIAGNOSIS — F419 Anxiety disorder, unspecified: Secondary | ICD-10-CM

## 2016-09-22 DIAGNOSIS — M722 Plantar fascial fibromatosis: Secondary | ICD-10-CM | POA: Diagnosis not present

## 2016-09-22 DIAGNOSIS — G90529 Complex regional pain syndrome I of unspecified lower limb: Secondary | ICD-10-CM | POA: Diagnosis not present

## 2016-10-20 DIAGNOSIS — G90529 Complex regional pain syndrome I of unspecified lower limb: Secondary | ICD-10-CM | POA: Diagnosis not present

## 2016-10-20 DIAGNOSIS — M79605 Pain in left leg: Secondary | ICD-10-CM | POA: Diagnosis not present

## 2016-10-20 DIAGNOSIS — M722 Plantar fascial fibromatosis: Secondary | ICD-10-CM | POA: Diagnosis not present

## 2016-10-20 DIAGNOSIS — M79672 Pain in left foot: Secondary | ICD-10-CM | POA: Diagnosis not present

## 2016-10-20 DIAGNOSIS — Z79891 Long term (current) use of opiate analgesic: Secondary | ICD-10-CM | POA: Diagnosis not present

## 2016-10-27 DIAGNOSIS — D1739 Benign lipomatous neoplasm of skin and subcutaneous tissue of other sites: Secondary | ICD-10-CM | POA: Diagnosis not present

## 2016-10-27 DIAGNOSIS — Z85828 Personal history of other malignant neoplasm of skin: Secondary | ICD-10-CM | POA: Diagnosis not present

## 2016-10-27 DIAGNOSIS — Z1283 Encounter for screening for malignant neoplasm of skin: Secondary | ICD-10-CM | POA: Diagnosis not present

## 2016-11-17 DIAGNOSIS — G90529 Complex regional pain syndrome I of unspecified lower limb: Secondary | ICD-10-CM | POA: Diagnosis not present

## 2016-11-17 DIAGNOSIS — M722 Plantar fascial fibromatosis: Secondary | ICD-10-CM | POA: Diagnosis not present

## 2016-12-15 DIAGNOSIS — G90529 Complex regional pain syndrome I of unspecified lower limb: Secondary | ICD-10-CM | POA: Diagnosis not present

## 2016-12-15 DIAGNOSIS — M722 Plantar fascial fibromatosis: Secondary | ICD-10-CM | POA: Diagnosis not present

## 2017-01-06 ENCOUNTER — Other Ambulatory Visit: Payer: Self-pay | Admitting: Family Medicine

## 2017-01-06 DIAGNOSIS — F419 Anxiety disorder, unspecified: Secondary | ICD-10-CM

## 2017-01-06 NOTE — Telephone Encounter (Signed)
prescription has been called into pharmacy. KW

## 2017-01-12 DIAGNOSIS — M5430 Sciatica, unspecified side: Secondary | ICD-10-CM | POA: Diagnosis not present

## 2017-01-12 DIAGNOSIS — G894 Chronic pain syndrome: Secondary | ICD-10-CM | POA: Diagnosis not present

## 2017-01-12 DIAGNOSIS — G90529 Complex regional pain syndrome I of unspecified lower limb: Secondary | ICD-10-CM | POA: Diagnosis not present

## 2017-01-12 DIAGNOSIS — M722 Plantar fascial fibromatosis: Secondary | ICD-10-CM | POA: Diagnosis not present

## 2017-01-12 DIAGNOSIS — M792 Neuralgia and neuritis, unspecified: Secondary | ICD-10-CM | POA: Diagnosis not present

## 2017-02-16 DIAGNOSIS — M792 Neuralgia and neuritis, unspecified: Secondary | ICD-10-CM | POA: Diagnosis not present

## 2017-02-16 DIAGNOSIS — M5430 Sciatica, unspecified side: Secondary | ICD-10-CM | POA: Diagnosis not present

## 2017-02-16 DIAGNOSIS — G894 Chronic pain syndrome: Secondary | ICD-10-CM | POA: Diagnosis not present

## 2017-03-05 DIAGNOSIS — R040 Epistaxis: Secondary | ICD-10-CM | POA: Diagnosis not present

## 2017-03-05 DIAGNOSIS — J340 Abscess, furuncle and carbuncle of nose: Secondary | ICD-10-CM | POA: Diagnosis not present

## 2017-04-01 DIAGNOSIS — M792 Neuralgia and neuritis, unspecified: Secondary | ICD-10-CM | POA: Diagnosis not present

## 2017-04-01 DIAGNOSIS — M5430 Sciatica, unspecified side: Secondary | ICD-10-CM | POA: Diagnosis not present

## 2017-04-01 DIAGNOSIS — G894 Chronic pain syndrome: Secondary | ICD-10-CM | POA: Diagnosis not present

## 2017-04-13 DIAGNOSIS — E782 Mixed hyperlipidemia: Secondary | ICD-10-CM | POA: Diagnosis not present

## 2017-04-13 DIAGNOSIS — I2699 Other pulmonary embolism without acute cor pulmonale: Secondary | ICD-10-CM | POA: Diagnosis not present

## 2017-04-13 DIAGNOSIS — R001 Bradycardia, unspecified: Secondary | ICD-10-CM | POA: Diagnosis not present

## 2017-04-28 DIAGNOSIS — M792 Neuralgia and neuritis, unspecified: Secondary | ICD-10-CM | POA: Diagnosis not present

## 2017-04-28 DIAGNOSIS — M5430 Sciatica, unspecified side: Secondary | ICD-10-CM | POA: Diagnosis not present

## 2017-04-28 DIAGNOSIS — G894 Chronic pain syndrome: Secondary | ICD-10-CM | POA: Diagnosis not present

## 2017-05-06 DIAGNOSIS — G894 Chronic pain syndrome: Secondary | ICD-10-CM | POA: Diagnosis not present

## 2017-05-06 DIAGNOSIS — M792 Neuralgia and neuritis, unspecified: Secondary | ICD-10-CM | POA: Diagnosis not present

## 2017-05-06 DIAGNOSIS — M5430 Sciatica, unspecified side: Secondary | ICD-10-CM | POA: Diagnosis not present

## 2017-05-25 ENCOUNTER — Telehealth: Payer: Self-pay | Admitting: *Deleted

## 2017-05-25 NOTE — Telephone Encounter (Signed)
Pt states he has some questions for Dr. Milinda Pointer.

## 2017-05-25 NOTE — Telephone Encounter (Signed)
I returned patient call, he stated that he has been having a lot of pain in his left arch the past week and wanted to know how many injections he could get.  I informed him that since it has been almost 3 years since last visit, that he would best benefit from coming back in to see Dr. Milinda Pointer and discuss treatment options.  He agreed and patient was sent to scheduling.

## 2017-06-08 ENCOUNTER — Other Ambulatory Visit: Payer: Self-pay | Admitting: Family Medicine

## 2017-06-08 DIAGNOSIS — F419 Anxiety disorder, unspecified: Secondary | ICD-10-CM

## 2017-06-15 ENCOUNTER — Ambulatory Visit: Payer: 59 | Admitting: Podiatry

## 2017-06-15 ENCOUNTER — Encounter: Payer: Self-pay | Admitting: Podiatry

## 2017-06-15 ENCOUNTER — Ambulatory Visit: Payer: No Typology Code available for payment source

## 2017-06-15 DIAGNOSIS — M722 Plantar fascial fibromatosis: Secondary | ICD-10-CM

## 2017-06-15 DIAGNOSIS — M792 Neuralgia and neuritis, unspecified: Secondary | ICD-10-CM | POA: Diagnosis not present

## 2017-06-15 DIAGNOSIS — G8929 Other chronic pain: Secondary | ICD-10-CM

## 2017-06-15 NOTE — Progress Notes (Signed)
He presents today for follow-up of plantar fasciitis neuritis of the bilateral foot.  States that had the same problem for the past 5 years and had all kinds of testing and currently chronic pain clinic taking hydrocodone on a daily basis.  He states that he was off of the hydrocodone he wants his feet feel better and he wants his life back.  I have treated him in the past the plantar fasciitis and pain to the medial longitudinal arch in orthotics made also no avail.  Cortisone injections prednisone nonsteroidal anti-inflammatories all failed to alleviate his symptoms.  He is seen multiple physicians including neurology primary care orthopedics.  Nerve conduction test was normal.  He has been to physical therapy and chiropractic therapy again which failed to alleviate his symptoms.  He states that it seems to work for a little while and then returns.  He has a history of neck and back pain.  He states that he is unable to lay on his back at night because he develops pain in his legs and his heels posteriorly he states that his back really bothers him only if he is laying on his abdomen but comments that a lot of the pain he experiences in his feet occurs at nighttime some with standing and the pain seems to worsen and radiate up his leg.  He states that he has very little pain in the buttocks.  He also relates that he has taken himself off of gabapentin and will never utilize this medicine again.  Objective: Vital signs are stable he is alert and oriented x3.  Pulses are strongly palpable.  He still has tenderness on palpation of his plantar fascia and medial longitudinal arch.  Neurologic sensorium is slightly diminished per Semmes Weinstein monofilament vibratory sensation is normal.  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  I do think that he has symptoms of plantar fasciitis which are augmented due to the central nervous  system issue.  Plan: I will refer him to Dr. Clydell Hakim for spinal evaluation and treatment with medications other than gabapentin and narcotics.  Possibly over time he would be a candidate for a spinal stimulator.

## 2017-06-16 NOTE — Addendum Note (Signed)
Addended by: Graceann Congress D on: 06/16/2017 12:13 PM   Modules accepted: Orders

## 2017-06-22 DIAGNOSIS — M9901 Segmental and somatic dysfunction of cervical region: Secondary | ICD-10-CM | POA: Diagnosis not present

## 2017-06-22 DIAGNOSIS — M9903 Segmental and somatic dysfunction of lumbar region: Secondary | ICD-10-CM | POA: Diagnosis not present

## 2017-06-22 DIAGNOSIS — M542 Cervicalgia: Secondary | ICD-10-CM | POA: Diagnosis not present

## 2017-06-23 DIAGNOSIS — M542 Cervicalgia: Secondary | ICD-10-CM | POA: Diagnosis not present

## 2017-06-23 DIAGNOSIS — M9903 Segmental and somatic dysfunction of lumbar region: Secondary | ICD-10-CM | POA: Diagnosis not present

## 2017-06-23 DIAGNOSIS — M9901 Segmental and somatic dysfunction of cervical region: Secondary | ICD-10-CM | POA: Diagnosis not present

## 2017-06-25 DIAGNOSIS — M542 Cervicalgia: Secondary | ICD-10-CM | POA: Diagnosis not present

## 2017-06-25 DIAGNOSIS — M9903 Segmental and somatic dysfunction of lumbar region: Secondary | ICD-10-CM | POA: Diagnosis not present

## 2017-06-25 DIAGNOSIS — M9901 Segmental and somatic dysfunction of cervical region: Secondary | ICD-10-CM | POA: Diagnosis not present

## 2017-06-26 ENCOUNTER — Encounter: Payer: Self-pay | Admitting: Family Medicine

## 2017-06-26 ENCOUNTER — Ambulatory Visit (INDEPENDENT_AMBULATORY_CARE_PROVIDER_SITE_OTHER): Payer: 59 | Admitting: Family Medicine

## 2017-06-26 VITALS — BP 110/70 | HR 68 | Temp 98.4°F | Resp 16 | Ht 71.5 in | Wt 194.0 lb

## 2017-06-26 DIAGNOSIS — Z0000 Encounter for general adult medical examination without abnormal findings: Secondary | ICD-10-CM | POA: Diagnosis not present

## 2017-06-26 DIAGNOSIS — Z86711 Personal history of pulmonary embolism: Secondary | ICD-10-CM | POA: Diagnosis not present

## 2017-06-26 DIAGNOSIS — F439 Reaction to severe stress, unspecified: Secondary | ICD-10-CM

## 2017-06-26 DIAGNOSIS — N400 Enlarged prostate without lower urinary tract symptoms: Secondary | ICD-10-CM

## 2017-06-26 DIAGNOSIS — E782 Mixed hyperlipidemia: Secondary | ICD-10-CM

## 2017-06-26 DIAGNOSIS — F411 Generalized anxiety disorder: Secondary | ICD-10-CM | POA: Diagnosis not present

## 2017-06-26 MED ORDER — DULOXETINE HCL 30 MG PO CPEP: 30.0000 mg | ORAL_CAPSULE | Freq: Every day | ORAL | 0 refills | Status: DC

## 2017-06-26 NOTE — Patient Instructions (Signed)
We will call you with the lab results. Call me in two weeks on how you are doing on the duloxetine. Do follow up with Dr. Amalia Hailey and Dr.Harkins as scheduled.

## 2017-06-26 NOTE — Progress Notes (Signed)
Subjective:     Patient ID: Kevin Carpenter, male   DOB: Aug 17, 1955, 62 y.o.   MRN: [id number] Chief Complaint  Patient presents with  . Annual Exam   HPI States he is under increased stress at work .Wishes to try a daily medication to help him cope. Current providers as follows: Dr. Rock Nephew, chiropractor; Dr.Evans, Urology; Dr. Nehemiah Massed, cardiology; Dr Milinda Pointer, podiatry; Dr. Maryjean Ka, neurosurgery; Dr. Nehemiah Massed, dermatology; and Dr.Crisp, pain management.  Review of Systems General: Dealing with chronic foot pain and stress at work. HEENT: regular dental visits and eye exams (contacts and glasses). Chronic tinnitus Cardiovascular: no chest pain, shortness of breath, or palpitations GI: occasional heartburn, no change in bowel habits or blood in the stool GU: nocturia x 0- 1, no change in bladder habits  Psychiatric: not depressed Musculoskeletal: chronic foot pain    Objective:   Physical Exam  Constitutional: He appears well-developed and well-nourished. No distress.  Eyes: PERRLA, EOMI Neck: no thyromegaly, tenderness or nodules, no cervical adenopathy, no carotid bruits ENT: TM's intact without inflammation; No tonsillar enlargement or exudate, Lungs: Clear Heart : RRR without murmur or gallop Abd: bowel sounds present, soft, non-tender, no organomegaly Rectal: deferred to urology Extremities: no edema     Assessment:    1. Annual physical exam - Comprehensive metabolic panel  2. Benign nodular prostatic hyperplasia without lower urinary tract symptoms: per urology  3. Mixed hyperlipidemia - Lipid panel  4. Generalized anxiety disorder: diazepam prn.  5. Situational stress: start duloxetine  6. Hx of pulmonary embolus: continue Xarelto Plan:    Further f/u pending lab results. Phone f/u in 2 weeks.

## 2017-06-29 DIAGNOSIS — M542 Cervicalgia: Secondary | ICD-10-CM | POA: Diagnosis not present

## 2017-06-29 DIAGNOSIS — Z0000 Encounter for general adult medical examination without abnormal findings: Secondary | ICD-10-CM | POA: Diagnosis not present

## 2017-06-29 DIAGNOSIS — M9903 Segmental and somatic dysfunction of lumbar region: Secondary | ICD-10-CM | POA: Diagnosis not present

## 2017-06-29 DIAGNOSIS — E782 Mixed hyperlipidemia: Secondary | ICD-10-CM | POA: Diagnosis not present

## 2017-06-29 DIAGNOSIS — M9901 Segmental and somatic dysfunction of cervical region: Secondary | ICD-10-CM | POA: Diagnosis not present

## 2017-06-30 DIAGNOSIS — M792 Neuralgia and neuritis, unspecified: Secondary | ICD-10-CM | POA: Diagnosis not present

## 2017-06-30 DIAGNOSIS — G894 Chronic pain syndrome: Secondary | ICD-10-CM | POA: Diagnosis not present

## 2017-06-30 DIAGNOSIS — M5430 Sciatica, unspecified side: Secondary | ICD-10-CM | POA: Diagnosis not present

## 2017-06-30 LAB — COMPREHENSIVE METABOLIC PANEL
A/G RATIO: 2 (ref 1.2–2.2)
ALBUMIN: 4.2 g/dL (ref 3.6–4.8)
ALT: 18 IU/L (ref 0–44)
AST: 16 IU/L (ref 0–40)
Alkaline Phosphatase: 80 IU/L (ref 39–117)
BILIRUBIN TOTAL: 1 mg/dL (ref 0.0–1.2)
BUN/Creatinine Ratio: 11 (ref 10–24)
BUN: 11 mg/dL (ref 8–27)
CALCIUM: 9.2 mg/dL (ref 8.6–10.2)
CO2: 23 mmol/L (ref 20–29)
Chloride: 101 mmol/L (ref 96–106)
Creatinine, Ser: 1.03 mg/dL (ref 0.76–1.27)
GFR calc Af Amer: 90 mL/min/{1.73_m2} (ref >59)
GFR, EST NON AFRICAN AMERICAN: 78 mL/min/{1.73_m2} (ref >59)
Globulin, Total: 2.1 g/dL (ref 1.5–4.5)
Glucose: 92 mg/dL (ref 65–99)
POTASSIUM: 4.5 mmol/L (ref 3.5–5.2)
SODIUM: 139 mmol/L (ref 134–144)
Total Protein: 6.3 g/dL (ref 6.0–8.5)

## 2017-06-30 LAB — LIPID PANEL
CHOLESTEROL TOTAL: 203 mg/dL — AB (ref 100–199)
Chol/HDL Ratio: 4.5 ratio (ref 0.0–5.0)
HDL: 45 mg/dL (ref >39)
LDL Calculated: 121 mg/dL — ABNORMAL HIGH (ref 0–99)
TRIGLYCERIDES: 185 mg/dL — AB (ref 0–149)
VLDL Cholesterol Cal: 37 mg/dL (ref 5–40)

## 2017-07-02 DIAGNOSIS — M542 Cervicalgia: Secondary | ICD-10-CM | POA: Diagnosis not present

## 2017-07-02 DIAGNOSIS — M9901 Segmental and somatic dysfunction of cervical region: Secondary | ICD-10-CM | POA: Diagnosis not present

## 2017-07-02 DIAGNOSIS — M9903 Segmental and somatic dysfunction of lumbar region: Secondary | ICD-10-CM | POA: Diagnosis not present

## 2017-07-03 ENCOUNTER — Other Ambulatory Visit: Payer: Self-pay | Admitting: Family Medicine

## 2017-07-03 ENCOUNTER — Telehealth: Payer: Self-pay | Admitting: Emergency Medicine

## 2017-07-03 MED ORDER — SERTRALINE HCL 50 MG PO TABS: 50.0000 mg | ORAL_TABLET | Freq: Every day | ORAL | 0 refills | Status: DC

## 2017-07-03 NOTE — Telephone Encounter (Signed)
Patient advised.KW 

## 2017-07-03 NOTE — Telephone Encounter (Signed)
Pt was suppose to call and let you know how he was doing on the Duloxetine and he reports that he feel tired and he does not like the way it makes him feel. He wants to stop it.

## 2017-07-03 NOTE — Telephone Encounter (Signed)
Stop the duloxetine. After a couple of days start a new medication in a different family-sertraline. I will sent this in for him.

## 2017-07-06 ENCOUNTER — Telehealth: Payer: Self-pay

## 2017-07-06 DIAGNOSIS — M9903 Segmental and somatic dysfunction of lumbar region: Secondary | ICD-10-CM | POA: Diagnosis not present

## 2017-07-06 DIAGNOSIS — M9901 Segmental and somatic dysfunction of cervical region: Secondary | ICD-10-CM | POA: Diagnosis not present

## 2017-07-06 DIAGNOSIS — M542 Cervicalgia: Secondary | ICD-10-CM | POA: Diagnosis not present

## 2017-07-06 NOTE — Telephone Encounter (Signed)
Patient is scheduled with Dr. Maryjean Ka at 08/12/17 at 2pm.

## 2017-07-06 NOTE — Telephone Encounter (Signed)
-----   Message from Rip Harbour, [facility] sent at 06/15/2017  3:10 PM EDT ----- Regarding: Referral to NeuroSurg Refer to Clydell Hakim for chronic neuropathic pain at [facility]

## 2017-07-16 ENCOUNTER — Other Ambulatory Visit: Payer: Self-pay | Admitting: Family Medicine

## 2017-07-16 ENCOUNTER — Telehealth: Payer: Self-pay | Admitting: Family Medicine

## 2017-07-16 NOTE — Telephone Encounter (Signed)
Please review and advise. KW 

## 2017-07-16 NOTE — Telephone Encounter (Signed)
Go down to 1/2 pill for a week then stop.

## 2017-07-16 NOTE — Telephone Encounter (Signed)
Patient has been advised. KW 

## 2017-07-16 NOTE — Telephone Encounter (Signed)
Pt called saying he was supposed to call two weeks after taking Zoloft and let Mikki Santee know how he was doing.  Pt states he cannot take Zoloft and would like you to instruct him how to go off of it.  He is having a lot of jittery feelings with this medication.  His call back is [phone]  Thanks Con Memos

## 2017-08-01 ENCOUNTER — Other Ambulatory Visit: Payer: Self-pay | Admitting: Family Medicine

## 2017-08-12 DIAGNOSIS — M79671 Pain in right foot: Secondary | ICD-10-CM | POA: Diagnosis not present

## 2017-08-12 DIAGNOSIS — M79672 Pain in left foot: Secondary | ICD-10-CM | POA: Diagnosis not present

## 2017-08-25 DIAGNOSIS — M792 Neuralgia and neuritis, unspecified: Secondary | ICD-10-CM | POA: Diagnosis not present

## 2017-08-25 DIAGNOSIS — M5430 Sciatica, unspecified side: Secondary | ICD-10-CM | POA: Diagnosis not present

## 2017-08-25 DIAGNOSIS — G894 Chronic pain syndrome: Secondary | ICD-10-CM | POA: Diagnosis not present

## 2017-10-20 DIAGNOSIS — G894 Chronic pain syndrome: Secondary | ICD-10-CM | POA: Diagnosis not present

## 2017-10-20 DIAGNOSIS — M5430 Sciatica, unspecified side: Secondary | ICD-10-CM | POA: Diagnosis not present

## 2017-10-20 DIAGNOSIS — M792 Neuralgia and neuritis, unspecified: Secondary | ICD-10-CM | POA: Diagnosis not present

## 2017-10-26 DIAGNOSIS — Z85828 Personal history of other malignant neoplasm of skin: Secondary | ICD-10-CM | POA: Diagnosis not present

## 2017-10-26 DIAGNOSIS — Z1283 Encounter for screening for malignant neoplasm of skin: Secondary | ICD-10-CM | POA: Diagnosis not present

## 2017-10-26 DIAGNOSIS — L821 Other seborrheic keratosis: Secondary | ICD-10-CM | POA: Diagnosis not present

## 2017-11-05 DIAGNOSIS — J0101 Acute recurrent maxillary sinusitis: Secondary | ICD-10-CM | POA: Diagnosis not present

## 2017-12-09 ENCOUNTER — Telehealth: Payer: Self-pay | Admitting: Neurology

## 2017-12-10 NOTE — Telephone Encounter (Signed)
FYI pts wife Dorene called scheduled the pt for 9/19 at 7:30

## 2017-12-10 NOTE — Telephone Encounter (Addendum)
Pt's wife Dorene called he is having pain in the foot and leg. He was last seen 01/07/15.  appt has been scheduled for 04/01/18, he would like to be seen sooner if possible. Thank you

## 2017-12-10 NOTE — Telephone Encounter (Signed)
Noted, thank you

## 2017-12-10 NOTE — Telephone Encounter (Signed)
I tried calling back. Got automated message stating call could not be completed. Can offer 730am appt to work him in if they call back

## 2017-12-16 DIAGNOSIS — Z5181 Encounter for therapeutic drug level monitoring: Secondary | ICD-10-CM | POA: Diagnosis not present

## 2017-12-16 DIAGNOSIS — G894 Chronic pain syndrome: Secondary | ICD-10-CM | POA: Diagnosis not present

## 2017-12-16 DIAGNOSIS — M5430 Sciatica, unspecified side: Secondary | ICD-10-CM | POA: Diagnosis not present

## 2017-12-17 ENCOUNTER — Telehealth: Payer: Self-pay | Admitting: Neurology

## 2017-12-17 ENCOUNTER — Other Ambulatory Visit: Payer: Self-pay

## 2017-12-17 ENCOUNTER — Ambulatory Visit: Payer: 59 | Admitting: Neurology

## 2017-12-17 ENCOUNTER — Encounter

## 2017-12-17 ENCOUNTER — Encounter: Payer: Self-pay | Admitting: Neurology

## 2017-12-17 VITALS — BP 98/60 | HR 67 | Ht 73.0 in | Wt 196.0 lb

## 2017-12-17 DIAGNOSIS — M79672 Pain in left foot: Secondary | ICD-10-CM

## 2017-12-17 DIAGNOSIS — M79604 Pain in right leg: Secondary | ICD-10-CM

## 2017-12-17 DIAGNOSIS — M79671 Pain in right foot: Secondary | ICD-10-CM | POA: Diagnosis not present

## 2017-12-17 DIAGNOSIS — M79605 Pain in left leg: Secondary | ICD-10-CM

## 2017-12-17 NOTE — Progress Notes (Signed)
Reason for visit: Bilateral foot pain  Kevin Carpenter is a 62 y.o. male  History of present illness:  Kevin Carpenter is a 62 year old right-handed white male with an 8-year history of bilateral foot pain.  The patient has been seen by multiple physicians including podiatry for plantar fasciitis.  He has had injections of the foot, he has had various treatments through at least 2 chiropractors, he has been seen by Dr. Primus Bravo and by Dr. Maryjean Ka for pain management.  He has been tried on multiple medications such as gabapentin and Lyrica without benefit.  The patient currently is taking on average of 3 Vicodin tablets daily.  The patient continues to have pain mainly in the heel area of the feet bilaterally, left greater than right.  He does have some back pain without radiation down the legs.  He reports no difficulty controlling the bowels or the bladder, he has no weakness of the legs or arms.  He denies any neck pain.  The patient has not had any change in balance.  The patient is having more pain when he is up on his feet, he feels better when he is off of his feet.  First thing in the morning, the feet have minimal pain, when he starts to walk the pain ensues.  The patient was seen here 3 years ago, EMG and nerve conduction study to include plantar sensory latencies were unremarkable, no evidence of a peripheral neuropathy was seen.  EMG did not show evidence of a lumbosacral radiculopathy.  The patient returns for further evaluation.  Past Medical History:  Diagnosis Date  . Anxiety   . DVT (deep venous thrombosis) ([place])   . Hx of blood clots   . Plantar fasciitis, bilateral   . Pulmonary embolism ([place])   . Reflux     Past Surgical History:  Procedure Laterality Date  . EMBOLECTOMY    . TONSILLECTOMY AND ADENOIDECTOMY  1960    Family History  Problem Relation Age of Onset  . Heart disease Mother   . Atrial fibrillation Mother   . Hearing loss Father   . Healthy Father   . Healthy Sister    . Healthy Daughter   . Heart Problems Maternal Uncle     Social history:  reports that he quit smoking about 31 years ago. His smoking use included cigarettes. He has a 16.00 pack-year smoking history. His smokeless tobacco use includes chew. He reports that he does not drink alcohol or use drugs.  Medications:  Prior to Admission medications   Medication Sig Start Date End Date Taking? Authorizing Provider  diazepam (VALIUM) 5 MG tablet TAKE 1 TABLET BY MOUTH EVERY 8 HOURS IF NEEDED 06/08/17  Yes Carmon Ginsberg, PA  folic acid (FOLVITE) 1 MG tablet Take 1 mg by mouth daily.   Yes [provider]  Glucosamine-Chondroitin 250-200 MG TABS Take by mouth daily.    Yes [provider]  HYDROcodone-acetaminophen (NORCO) 10-325 MG tablet Take 1 tablet by mouth every 6 (six) hours as needed.   Yes [provider]  Misc Natural Products (OSTEO BI-FLEX ADV DOUBLE ST PO) Take by mouth.   Yes [provider]  Multiple Vitamin (MULTI-VITAMINS) TABS Take by mouth daily.    Yes [provider]  Omega-3 Fatty Acids (FISH OIL) 1200 MG CAPS Take by mouth.   Yes [provider]  omeprazole (PRILOSEC) 40 MG capsule Take 40 mg by mouth as needed.  07/07/13  Yes [provider]  rivaroxaban (XARELTO) 20 MG TABS tablet TAKE 1 TABLET BY MOUTH EVERY DAY 07/18/14  Yes [provider]      Allergies  Allergen Reactions  . Duloxetine     fatigue  . Gabapentin     "loopy"  . Sertraline     jitteriness    ROS:  Out of a complete 14 system review of symptoms, the patient complains only of the following symptoms, and all other reviewed systems are negative.  Bilateral foot pain  Blood pressure 98/60, pulse 67, height 6\' 1"  (1.854 m), weight 196 lb (88.9 kg).  Physical Exam  General: The patient is alert and cooperative at the time of the examination.  Eyes: Pupils are equal, round, and reactive to light. Discs are flat  bilaterally.  Neck: The neck is supple, no carotid bruits are noted.  Respiratory: The respiratory examination is clear.  Cardiovascular: The cardiovascular examination reveals a regular rate and rhythm, no obvious murmurs or rubs are noted.  Skin: Extremities are without significant edema.  Neurologic Exam  Mental status: The patient is alert and oriented x 3 at the time of the examination. The patient has apparent normal recent and remote memory, with an apparently normal attention span and concentration ability.  Cranial nerves: Facial symmetry is present. There is good sensation of the face to pinprick and soft touch bilaterally. The strength of the facial muscles and the muscles to head turning and shoulder shrug are normal bilaterally. Speech is well enunciated, no aphasia or dysarthria is noted. Extraocular movements are full. Visual fields are full. The tongue is midline, and the patient has symmetric elevation of the soft palate. No obvious hearing deficits are noted.  Motor: The motor testing reveals 5 over 5 strength of all 4 extremities. Good symmetric motor tone is noted throughout.  Sensory: Sensory testing is intact to pinprick, soft touch, vibration sensation, and position sense on all 4 extremities. No evidence of extinction is noted.  Coordination: Cerebellar testing reveals good finger-nose-finger and heel-to-shin bilaterally.  Gait and station: Gait is normal. Tandem gait is normal. Romberg is negative. No drift is seen.  The patient is able to walk on heels and the toes bilaterally.  Reflexes: Deep tendon reflexes are symmetric and normal bilaterally, with exception at the ankle jerk reflexes are depressed bilaterally. Toes are downgoing bilaterally.   Assessment/Plan:  1.  Bilateral foot pain, likely mechanical source pain  The patient apparently has been told through his podiatry physician that he does not have plantar fasciitis.  He has been told that he has a  peripheral neuropathy.  This was not documented on EMG and nerve conduction study 3 years ago.  We will repeat the study with NCV of the legs bilaterally and EMG of the left leg. We will get MRI of the lumbar spine.  The patient will follow-up for the EMG evaluation.  The clinical history supports a mechanical source pain in the feet, not a neuropathy or radiculopathy, but the patient does have depressed ankle jerk reflexes bilaterally on clinical examination.   Jill Alexanders MD 12/17/2017 7:52 AM  [facility] [address] [place], [place] [address]  Phone [phone] Fax [phone]

## 2017-12-17 NOTE — Telephone Encounter (Signed)
Patient wife called me back he is scheduled for 12/23/17 at [facility].

## 2017-12-17 NOTE — Telephone Encounter (Signed)
I spoke to the pt he asked me to give his wife Dorene a call to schedule his mri. I did leave her a vmail to call me back.  [place]: [phone] (exp. 12/17/17 to 01/31/18)

## 2017-12-22 ENCOUNTER — Ambulatory Visit (INDEPENDENT_AMBULATORY_CARE_PROVIDER_SITE_OTHER): Payer: 59 | Admitting: Neurology

## 2017-12-22 ENCOUNTER — Encounter: Payer: Self-pay | Admitting: Neurology

## 2017-12-22 ENCOUNTER — Ambulatory Visit: Payer: 59 | Admitting: Neurology

## 2017-12-22 DIAGNOSIS — M79671 Pain in right foot: Secondary | ICD-10-CM

## 2017-12-22 DIAGNOSIS — M79672 Pain in left foot: Secondary | ICD-10-CM | POA: Diagnosis not present

## 2017-12-22 DIAGNOSIS — M722 Plantar fascial fibromatosis: Secondary | ICD-10-CM

## 2017-12-22 DIAGNOSIS — M79604 Pain in right leg: Secondary | ICD-10-CM

## 2017-12-22 DIAGNOSIS — M79605 Pain in left leg: Secondary | ICD-10-CM

## 2017-12-22 NOTE — Progress Notes (Signed)
Please refer to EMG and nerve conduction procedure note.  

## 2017-12-22 NOTE — Procedures (Signed)
     HISTORY:  Kevin Carpenter is a 62 year old gentleman with bilateral foot pain with weightbearing, left greater than right.  He is being evaluated for possible neuropathy or a lumbosacral radiculopathy.  NERVE CONDUCTION STUDIES:  Nerve conduction studies were performed on both lower extremities. The distal motor latencies and motor amplitudes for the peroneal and posterior tibial nerves were within normal limits. The nerve conduction velocities for these nerves were also normal. The sensory latencies for the peroneal and sural nerves were within normal limits.  The medial and lateral plantar sensory latencies for the left foot were normal.  The F wave latencies for the posterior tibial nerves were within normal limits.   EMG STUDIES:  EMG study was performed on the left lower extremity:  The tibialis anterior muscle reveals 2 to 4K motor units with full recruitment. No fibrillations or positive waves were seen. The peroneus tertius muscle reveals 2 to 4K motor units with full recruitment. No fibrillations or positive waves were seen. The medial gastrocnemius muscle reveals 1 to 3K motor units with full recruitment. No fibrillations or positive waves were seen. The vastus lateralis muscle reveals 2 to 4K motor units with full recruitment. No fibrillations or positive waves were seen. The iliopsoas muscle reveals 2 to 4K motor units with full recruitment. No fibrillations or positive waves were seen. The biceps femoris muscle (long head) reveals 2 to 4K motor units with full recruitment. No fibrillations or positive waves were seen. The lumbosacral paraspinal muscles were tested at 3 levels, and revealed no abnormalities of insertional activity at all 3 levels tested. There was good relaxation.   IMPRESSION:  Nerve conduction studies done on both lower extremities were within normal limits.  No evidence of a neuropathy was seen.  EMG evaluation of the left lower extremity was unremarkable, no  evidence of an overlying lumbosacral radiculopathy was seen.  Jill Alexanders MD 12/22/2017 1:43 PM  [facility] [address] [place], [place] [address]  Phone [phone] Fax [phone]

## 2017-12-23 ENCOUNTER — Ambulatory Visit: Payer: 59

## 2017-12-23 DIAGNOSIS — M79672 Pain in left foot: Secondary | ICD-10-CM | POA: Diagnosis not present

## 2017-12-23 DIAGNOSIS — M79604 Pain in right leg: Secondary | ICD-10-CM

## 2017-12-23 DIAGNOSIS — M79671 Pain in right foot: Secondary | ICD-10-CM | POA: Diagnosis not present

## 2017-12-23 DIAGNOSIS — M79605 Pain in left leg: Secondary | ICD-10-CM

## 2017-12-23 NOTE — Progress Notes (Signed)
[facility]    Nerve / Sites Muscle Latency Ref. Amplitude Ref. Rel Amp Segments Distance Velocity Ref. Area    ms ms mV mV %  cm m/s m/s mVms  R Peroneal - EDB     Ankle EDB 5.2 ?6.5 2.9 ?2.0 100 Ankle - EDB 9   11.6     Fib head EDB 13.0  2.0  69.6 Fib head - Ankle 34 44 ?44 10.0     Pop fossa EDB 15.7  2.1  103 Pop fossa - Fib head 12 44 ?44 10.2         Pop fossa - Ankle      L Peroneal - EDB     Ankle EDB 5.8 ?6.5 3.1 ?2.0 100 Ankle - EDB 9   10.7     Fib head EDB 13.6  2.1  67.2 Fib head - Ankle 34 44 ?44 8.1     Pop fossa EDB 16.2  2.4  117 Pop fossa - Fib head 12 46 ?44 9.8         Pop fossa - Ankle      R Tibial - AH     Ankle AH 4.4 ?5.8 7.1 ?4.0 100 Ankle - AH 9   19.2     Pop fossa AH 14.6  4.7  66.9 Pop fossa - Ankle 43 42 ?41 16.1  L Tibial - AH     Ankle AH 4.6 ?5.8 6.1 ?4.0 100 Ankle - AH 9   14.7     Pop fossa AH 14.6  3.0  48.9 Pop fossa - Ankle 44 44 ?41 11.1             SNC    Nerve / Sites Rec. Site Peak Lat Ref.  Amp Ref. Segments Distance Peak Diff Ref.    ms ms V V  cm ms ms  R Sural - Ankle (Calf)     Calf Ankle 3.6 ?4.4 9 ?6 Calf - Ankle 14    L Sural - Ankle (Calf)     Calf Ankle 3.8 ?4.4 10 ?6 Calf - Ankle 14    R Superficial peroneal - Ankle     Lat leg Ankle 4.3 ?4.4 7 ?6 Lat leg - Ankle 14    L Superficial peroneal - Ankle     Lat leg Ankle 4.0 ?4.4 5 ?6 Lat leg - Ankle 14    L Medial plantar, Lateral plantar - Ankle (Medial, lateral sole)     Medial plantar Sole Ankle 3.1 ?5.9 5 ?3 Medial plantar Sole - Ankle 14       Lateral plantar Sole Ankle 3.2 ?5.9 6 ?3 Lateral plantar Sole - Ankle 14          Medial plantar Sole - Lateral plantar Sole  -0.1 ?0.0               F  Wave    Nerve F Lat Ref.   ms ms  R Tibial - AH 49.8 ?56.0  L Tibial - AH 50.0 ?56.0         EMG full

## 2017-12-25 ENCOUNTER — Telehealth: Payer: Self-pay | Admitting: Neurology

## 2017-12-25 DIAGNOSIS — M79671 Pain in right foot: Secondary | ICD-10-CM

## 2017-12-25 DIAGNOSIS — M79672 Pain in left foot: Principal | ICD-10-CM

## 2017-12-25 NOTE — Telephone Encounter (Signed)
  I called the patient.  The MRI of the lumbar spine is completely normal.  The patient requests a second opinion from another orthopedic surgeon, I will refer the patient to [facility] orthopedics regarding the feet, patient likely has plantar fasciitis or some other mechanical source of pain.  There is no evidence of a radiculopathy or neuropathy.  The patient wishes to have a copy of the MRI report.   MRI lumbar 12/24/17:  IMPRESSION:   Unremarkable MRI lumbar spine (without). No spinal stenosis or foraminal narrowing.

## 2017-12-30 ENCOUNTER — Telehealth: Payer: Self-pay | Admitting: Neurology

## 2017-12-30 NOTE — Telephone Encounter (Signed)
Pt schedule at [facility] for 01/29/18 at 8:45 to see Dr. Verlan Friends

## 2018-01-11 DIAGNOSIS — Q666 Other congenital valgus deformities of feet: Secondary | ICD-10-CM | POA: Diagnosis not present

## 2018-01-11 DIAGNOSIS — M79672 Pain in left foot: Secondary | ICD-10-CM | POA: Diagnosis not present

## 2018-01-11 DIAGNOSIS — M79671 Pain in right foot: Secondary | ICD-10-CM | POA: Diagnosis not present

## 2018-01-12 DIAGNOSIS — I2699 Other pulmonary embolism without acute cor pulmonale: Secondary | ICD-10-CM | POA: Diagnosis not present

## 2018-01-12 DIAGNOSIS — I719 Aortic aneurysm of unspecified site, without rupture: Secondary | ICD-10-CM | POA: Diagnosis not present

## 2018-01-12 DIAGNOSIS — E782 Mixed hyperlipidemia: Secondary | ICD-10-CM | POA: Diagnosis not present

## 2018-02-01 ENCOUNTER — Other Ambulatory Visit: Payer: Self-pay | Admitting: Family Medicine

## 2018-02-01 DIAGNOSIS — F419 Anxiety disorder, unspecified: Secondary | ICD-10-CM

## 2018-04-01 ENCOUNTER — Ambulatory Visit: Payer: No Typology Code available for payment source | Admitting: Neurology

## 2018-04-19 ENCOUNTER — Telehealth: Payer: Self-pay | Admitting: Family Medicine

## 2018-04-19 NOTE — Telephone Encounter (Signed)
Per Wife he is seeing Dr. Nehemiah Massed today and will call if he wishes to switch to Dr. Caryl Comes.

## 2018-04-19 NOTE — Telephone Encounter (Signed)
Please review

## 2018-04-19 NOTE — Telephone Encounter (Signed)
Pt wanting a referral to a new Cardiologist in [place]   -  Fax [phone]  Wanting to get him in asap.  Please advise.  Thanks, [employer]

## 2018-10-13 ENCOUNTER — Telehealth: Payer: Self-pay

## 2018-10-13 NOTE — Telephone Encounter (Signed)
Pt returned call ° °teri °

## 2018-10-13 NOTE — Telephone Encounter (Signed)
[place] so can schedule a e-visit to with a new provider. Also to see if [facility] faxed over a request for labs for elevated PSA and we need to know if they want the office to order the lab or they wanting results.

## 2018-10-14 NOTE — Telephone Encounter (Signed)
Spoke to patient wife ( Dorene per DRP) and appointment was schedule for a CPE on 10/15/2018 @ 3:20 PM w/ Carles Collet.

## 2018-10-15 ENCOUNTER — Other Ambulatory Visit: Payer: Self-pay | Admitting: Physician Assistant

## 2018-10-15 ENCOUNTER — Encounter: Payer: Self-pay | Admitting: Physician Assistant

## 2018-10-15 ENCOUNTER — Ambulatory Visit (INDEPENDENT_AMBULATORY_CARE_PROVIDER_SITE_OTHER): Payer: 59 | Admitting: Physician Assistant

## 2018-10-15 ENCOUNTER — Other Ambulatory Visit: Payer: Self-pay

## 2018-10-15 VITALS — BP 120/80 | HR 78 | Temp 97.9°F | Resp 16 | Ht 73.0 in | Wt 200.0 lb

## 2018-10-15 DIAGNOSIS — Z114 Encounter for screening for human immunodeficiency virus [HIV]: Secondary | ICD-10-CM

## 2018-10-15 DIAGNOSIS — Z1211 Encounter for screening for malignant neoplasm of colon: Secondary | ICD-10-CM | POA: Diagnosis not present

## 2018-10-15 DIAGNOSIS — R972 Elevated prostate specific antigen [PSA]: Secondary | ICD-10-CM | POA: Diagnosis not present

## 2018-10-15 DIAGNOSIS — Z1159 Encounter for screening for other viral diseases: Secondary | ICD-10-CM

## 2018-10-15 DIAGNOSIS — I7121 Aneurysm of the ascending aorta, without rupture: Secondary | ICD-10-CM

## 2018-10-15 DIAGNOSIS — Z0000 Encounter for general adult medical examination without abnormal findings: Secondary | ICD-10-CM

## 2018-10-15 DIAGNOSIS — I719 Aortic aneurysm of unspecified site, without rupture: Secondary | ICD-10-CM

## 2018-10-15 DIAGNOSIS — F411 Generalized anxiety disorder: Secondary | ICD-10-CM

## 2018-10-15 NOTE — Patient Instructions (Signed)
Shingrix: Call insurance about 2 dose shingles vaccine NOT Zostavax

## 2018-10-15 NOTE — Progress Notes (Signed)
Patient: Kevin Carpenter, Male    DOB: 09-08-1955, 63 y.o.   MRN: [id number] Visit Date: 10/15/2018  Today's Provider: Trinna Post, PA-C   Chief Complaint  Patient presents with  . Annual Exam   Subjective:     Annual physical exam Kevin Carpenter is a 63 y.o. male who presents today for health maintenance and complete physical. He feels well. He reports exercising none. He reports he is sleeping well. Previously saw Mariel Sleet, PA-C who has since retired.  Works at [employer]. Married to wife of 26 years. Has a Step daughter. Quit smoking 1988  Dr. Amalia Hailey   Colonoscopy: 2011 with 6 mm tubular adenoma, repeat in 3-5 years.   History of DVT and PE: Reports blood clot in upper thigh in 2004 that required surgical intervention. Reports blood clot in left calf in 2015 after stress test. He was placed on Xarelto by Dr. Nehemiah Massed and sent for vascular evaluation with Dr. Ronalee Belts. He had a CTA chest 02/2014 which revealed bilateral large volume pulmonary emboli. He denies clotting disorder. He will need to take Xarelto indefinitely.   Plantar Fasciitis: Followed by [facility] Dr. Pasty Spillers and also Dr. Primus Bravo for pain management. He receives Norco for this which he plans to taper off.   Elevated PSA: He has had an elevated PSA in the past, which has resolved. He typically saw Dr. Amalia Hailey with [facility] urology. Had not seen in the past two years until most recently on 10/14/2018 telemedicine visit. He has not yet gotten labwork.   Wt Readings from Last 3 Encounters:  10/15/18 200 lb (90.7 kg)  12/17/17 196 lb (88.9 kg)  06/26/17 194 lb (88 kg)   Anxiety: Valium 5 mg, last filled 02/08/2018 #30. Reports he takes these sparingly after a particularly stressful day.  -------------------------------------------------------------   Review of Systems  All other systems reviewed and are negative.   Social History      He  reports that he quit smoking about 32 years ago. His smoking use  included cigarettes. He has a 16.00 pack-year smoking history. His smokeless tobacco use includes chew. He reports that he does not drink alcohol or use drugs.       Social History   Socioeconomic History  . Marital status: Married    Spouse name: Kevin Carpenter  . Number of children: 0  . Years of education: 12th  . Highest education level: Not on file  Occupational History    Employer: OTHER    Comment: [employer]  Social Needs  . Financial resource strain: Not on file  . Food insecurity    Worry: Not on file    Inability: Not on file  . Transportation needs    Medical: Not on file    Non-medical: Not on file  Tobacco Use  . Smoking status: Former Smoker    Packs/day: 1.00    Years: 16.00    Pack years: 16.00    Types: Cigarettes    Quit date: 03/31/1986    Years since quitting: 32.5  . Smokeless tobacco: Current User    Types: Chew  . Tobacco comment: quit smoking   Substance and Sexual Activity  . Alcohol use: No    Alcohol/week: 0.0 standard drinks  . Drug use: No  . Sexual activity: Not on file  Lifestyle  . Physical activity    Days per week: Not on file    Minutes per session: Not on file  .  Stress: Not on file  Relationships  . Social Herbalist on phone: Not on file    Gets together: Not on file    Attends religious service: Not on file    Active member of club or organization: Not on file    Attends meetings of clubs or organizations: Not on file    Relationship status: Not on file  Other Topics Concern  . Not on file  Social History Narrative   Pt lives at home with spouse.   Caffeine Use: 1 cup daily   Right handed     Past Medical History:  Diagnosis Date  . Anxiety   . DVT (deep venous thrombosis) ([place])   . Hx of blood clots   . Plantar fasciitis, bilateral   . Pulmonary embolism ([place])   . Reflux      Patient Active Problem List   Diagnosis Date Noted  . Allergic rhinitis 06/20/2016  . Benign nodular prostatic hyperplasia  without lower urinary tract symptoms 06/20/2016  . History of bradycardia 03/20/2015  . Mixed hyperlipidemia 03/20/2015  . Hx of pulmonary embolus 02/12/2015  . Brash 02/12/2015  . Pain in joint, ankle and foot 12/22/2014  . Plantar fasciitis, bilateral 09/26/2014  . Complex regional pain syndrome of both lower extremities 09/26/2014  . Aneurysm of aortic root ([place]) 02/28/2014  . Anxiety disorder 02/01/2014    Past Surgical History:  Procedure Laterality Date  . EMBOLECTOMY    . TONSILLECTOMY AND ADENOIDECTOMY  1960    Family History        Family Status  Relation Name Status  . Mother  Alive  . Father  Alive  . Sister  Alive  . Daughter stepchild Alive  . Mat Uncle  (Not Specified)        His family history includes Atrial fibrillation in his mother; Healthy in his daughter, father, and sister; Hearing loss in his father; Heart Problems in his maternal uncle; Heart disease in his mother.      Allergies  Allergen Reactions  . Duloxetine     fatigue  . Gabapentin     "loopy"  . Sertraline     jitteriness     Current Outpatient Medications:  .  diazepam (VALIUM) 5 MG tablet, TAKE 1 TABLET BY MOUTH EVERY 8 HOURS IF NEEDED, Disp: 30 tablet, Rfl: 0 .  folic acid (FOLVITE) 1 MG tablet, Take 1 mg by mouth daily., Disp: , Rfl:  .  Glucosamine-Chondroitin 250-200 MG TABS, Take by mouth daily. , Disp: , Rfl:  .  HYDROcodone-acetaminophen (NORCO) 10-325 MG tablet, Take 1 tablet by mouth every 6 (six) hours as needed., Disp: , Rfl:  .  Misc Natural Products (OSTEO BI-FLEX ADV DOUBLE ST PO), Take by mouth., Disp: , Rfl:  .  Multiple Vitamin (MULTI-VITAMINS) TABS, Take by mouth daily. , Disp: , Rfl:  .  Omega-3 Fatty Acids (FISH OIL) 1200 MG CAPS, Take by mouth., Disp: , Rfl:  .  omeprazole (PRILOSEC) 40 MG capsule, Take 40 mg by mouth as needed. , Disp: , Rfl:  .  rivaroxaban (XARELTO) 20 MG TABS tablet, TAKE 1 TABLET BY MOUTH EVERY DAY, Disp: , Rfl:    Patient Care  Team: Paulene Floor as PCP - General (Physician Assistant) Garrel Ridgel, DPM as Consulting Physician (Podiatry) Mohammed Kindle, MD as Attending Physician (Pain Medicine) Wylene Simmer, MD as Consulting Physician (Orthopedic Surgery)    Objective:    Vitals: BP 120/80 (BP  Location: Left Arm, Patient Position: Sitting, Cuff Size: Large)   Pulse 78   Temp 97.9 F (36.6 C) (Oral)   Resp 16   Ht 6\' 1"  (1.854 m)   Wt 200 lb (90.7 kg)   SpO2 94%   BMI 26.39 kg/m    Vitals:   10/15/18 1522  BP: 120/80  Pulse: 78  Resp: 16  Temp: 97.9 F (36.6 C)  TempSrc: Oral  SpO2: 94%  Weight: 200 lb (90.7 kg)  Height: 6\' 1"  (1.854 m)     Physical Exam Constitutional:      Appearance: Normal appearance.  Cardiovascular:     Rate and Rhythm: Normal rate and regular rhythm.     Heart sounds: Normal heart sounds.  Pulmonary:     Effort: Pulmonary effort is normal.     Breath sounds: Normal breath sounds.  Abdominal:     General: Bowel sounds are normal.     Palpations: Abdomen is soft.  Skin:    General: Skin is warm and dry.  Neurological:     Mental Status: He is alert and oriented to person, place, and time. Mental status is at baseline.  Psychiatric:        Mood and Affect: Mood normal.        Behavior: Behavior normal.      Depression Screen PHQ 2/9 Scores 10/15/2018 06/26/2017 06/20/2016 06/20/2016  PHQ - 2 Score 0 1 0 0  PHQ- 9 Score 0 2 - -  Exception Documentation - - - -       Assessment & Plan:     Routine Health Maintenance and Physical Exam  Exercise Activities and Dietary recommendations Goals   None     Immunization History  Administered Date(s) Administered  . Influenza,inj,Quad PF,6+ Mos 02/13/2015  . Tdap 01/12/2012    Health Maintenance  Topic Date Due  . Hepatitis C Screening  [birth year]  . HIV Screening  11/07/1970  . INFLUENZA VACCINE  10/30/2018  . COLONOSCOPY  05/19/2019  . TETANUS/TDAP  01/11/2022     Discussed health  benefits of physical activity, and encouraged him to engage in regular exercise appropriate for his age and condition.    1. Annual physical exam  - Comprehensive Metabolic Panel (CMET) - TSH - Lipid Profile - CBC with Differential  2. Elevated PSA  - PSA  3. Colon cancer screening  He is overdue for colonoscopy. Referral back to [facility] clinic who did most recent colonoscopy.   - Ambulatory referral to Gastroenterology  4. Encounter for screening for HIV  - HIV antibody (with reflex)  5. Encounter for hepatitis C screening test for low risk patient  - Hepatitis c antibody (reflex)  6. Aneurysm of aortic root ([place])  Followed by Dr. Nehemiah Massed with [facility].   7. Generalized anxiety disorder  Takes Valium sparingly. No plans to increase this, if anxiety becomes more regular will need maintenance medication.  The entirety of the information documented in the History of Present Illness, Review of Systems and Physical Exam were personally obtained by me. Portions of this information were initially documented by April M. Sabra Heck, CMA and reviewed by me for thoroughness and accuracy.   F/u 1 year CPE --------------------------------------------------------------------  I,April Miller,acting as a scribe for [employer], PA-C.,have documented all relevant documentation on the behalf of Trinna Post, PA-C,as directed by  Trinna Post, PA-C while in the presence of Trinna Post, PA-C.   Trinna Post, PA-C  [facility]

## 2018-10-16 LAB — CBC WITH DIFFERENTIAL/PLATELET
Basophils Absolute: 0 10*3/uL (ref 0.0–0.2)
Basos: 0 %
EOS (ABSOLUTE): 0.1 10*3/uL (ref 0.0–0.4)
Eos: 1 %
Hematocrit: 45.4 % (ref 37.5–51.0)
Hemoglobin: 15.7 g/dL (ref 13.0–17.7)
Immature Grans (Abs): 0 10*3/uL (ref 0.0–0.1)
Immature Granulocytes: 0 %
Lymphocytes Absolute: 2.4 10*3/uL (ref 0.7–3.1)
Lymphs: 34 %
MCH: 30.8 pg (ref 26.6–33.0)
MCHC: 34.6 g/dL (ref 31.5–35.7)
MCV: 89 fL (ref 79–97)
Monocytes Absolute: 0.4 10*3/uL (ref 0.1–0.9)
Monocytes: 6 %
Neutrophils Absolute: 4.1 10*3/uL (ref 1.4–7.0)
Neutrophils: 59 %
Platelets: 251 10*3/uL (ref 150–450)
RBC: 5.09 x10E6/uL (ref 4.14–5.80)
RDW: 12.4 % (ref 11.6–15.4)
WBC: 7 10*3/uL (ref 3.4–10.8)

## 2018-10-16 LAB — COMPREHENSIVE METABOLIC PANEL
ALT: 29 IU/L (ref 0–44)
AST: 26 IU/L (ref 0–40)
Albumin/Globulin Ratio: 2.1 (ref 1.2–2.2)
Albumin: 4.5 g/dL (ref 3.8–4.8)
Alkaline Phosphatase: 80 IU/L (ref 39–117)
BUN/Creatinine Ratio: 11 (ref 10–24)
BUN: 11 mg/dL (ref 8–27)
Bilirubin Total: 0.6 mg/dL (ref 0.0–1.2)
CO2: 22 mmol/L (ref 20–29)
Calcium: 9.6 mg/dL (ref 8.6–10.2)
Chloride: 102 mmol/L (ref 96–106)
Creatinine, Ser: 0.97 mg/dL (ref 0.76–1.27)
GFR calc Af Amer: 96 mL/min/{1.73_m2} (ref >59)
GFR calc non Af Amer: 83 mL/min/{1.73_m2} (ref >59)
Globulin, Total: 2.1 g/dL (ref 1.5–4.5)
Glucose: 97 mg/dL (ref 65–99)
Potassium: 4.5 mmol/L (ref 3.5–5.2)
Sodium: 140 mmol/L (ref 134–144)
Total Protein: 6.6 g/dL (ref 6.0–8.5)

## 2018-10-16 LAB — LIPID PANEL
Chol/HDL Ratio: 5.4 ratio — ABNORMAL HIGH (ref 0.0–5.0)
Cholesterol, Total: 212 mg/dL — ABNORMAL HIGH (ref 100–199)
HDL: 39 mg/dL — ABNORMAL LOW (ref >39)
LDL Calculated: 99 mg/dL (ref 0–99)
Triglycerides: 371 mg/dL — ABNORMAL HIGH (ref 0–149)
VLDL Cholesterol Cal: 74 mg/dL — ABNORMAL HIGH (ref 5–40)

## 2018-10-16 LAB — HIV ANTIBODY (ROUTINE TESTING W REFLEX): HIV Screen 4th Generation wRfx: NONREACTIVE

## 2018-10-16 LAB — PSA: Prostate Specific Ag, Serum: 4.5 ng/mL — ABNORMAL HIGH (ref 0.0–4.0)

## 2018-10-16 LAB — TSH: TSH: 2.16 u[IU]/mL (ref 0.450–4.500)

## 2018-10-16 LAB — HCV COMMENT:

## 2018-10-16 LAB — HEPATITIS C ANTIBODY (REFLEX): HCV Ab: <0.1 s/co ratio (ref 0.0–0.9)

## 2018-10-28 ENCOUNTER — Encounter (INDEPENDENT_AMBULATORY_CARE_PROVIDER_SITE_OTHER): Payer: 59 | Admitting: Physician Assistant

## 2018-10-28 DIAGNOSIS — F411 Generalized anxiety disorder: Secondary | ICD-10-CM | POA: Diagnosis not present

## 2018-11-03 MED ORDER — SERTRALINE HCL 50 MG PO TABS: 50.0000 mg | ORAL_TABLET | Freq: Every day | ORAL | 0 refills | Status: DC

## 2018-11-03 NOTE — Telephone Encounter (Signed)
Patient with worsening anxiety. Will start zoloft 50 mg daily. Counseled on risks, side effects and expected course of action. Please let patient know I sent it. He also has an intolerance listed to this in his chart, but I think it will be OK for him to try again. Please schedule for telephone f/u in 6 weeks.   I have spent 25 minutes in the care of this patient.

## 2018-11-08 ENCOUNTER — Telehealth: Payer: Self-pay | Admitting: Physician Assistant

## 2018-11-08 NOTE — Telephone Encounter (Signed)
Spoke with patient about zoloft, he reports he had suicidal thoughts, headache, jaw stiffness, among other side effects. He has stopped taking the medication and I agree this is the best. Counseled on different options regarding anxiety including PRN use of hydroxyzine, Buspar, switching SSRIs, or referring to a psychiatrist. Do not feel comfortable refilling valium along with hydrocodone. Patient understands. He will consult with his pain management doctor, Dr. Primus Bravo, before getting back to me.

## 2018-11-08 NOTE — Telephone Encounter (Signed)
Patient sent a mychart message c/o having an adverse reaction to Zoloft. He reports that he has only taken 2 doses of the medication and it gave him really bad tremors and suicidal thoughts. He reports that he had to take 1/2 tablet of diazeapam each day to calm down.   Patient is wanting to speak with you in regards to the medication. Did you want to work him in as a telephone visit? Please advise. Thanks!

## 2019-01-18 DIAGNOSIS — Z8042 Family history of malignant neoplasm of prostate: Secondary | ICD-10-CM | POA: Diagnosis not present

## 2019-01-18 DIAGNOSIS — N400 Enlarged prostate without lower urinary tract symptoms: Secondary | ICD-10-CM | POA: Diagnosis not present

## 2019-01-19 DIAGNOSIS — R972 Elevated prostate specific antigen [PSA]: Secondary | ICD-10-CM | POA: Diagnosis not present

## 2019-02-02 DIAGNOSIS — Z5181 Encounter for therapeutic drug level monitoring: Secondary | ICD-10-CM | POA: Diagnosis not present

## 2019-02-02 DIAGNOSIS — M5430 Sciatica, unspecified side: Secondary | ICD-10-CM | POA: Diagnosis not present

## 2019-02-02 DIAGNOSIS — G894 Chronic pain syndrome: Secondary | ICD-10-CM | POA: Diagnosis not present

## 2019-02-02 DIAGNOSIS — M792 Neuralgia and neuritis, unspecified: Secondary | ICD-10-CM | POA: Diagnosis not present

## 2019-02-08 DIAGNOSIS — G894 Chronic pain syndrome: Secondary | ICD-10-CM | POA: Diagnosis not present

## 2019-02-08 DIAGNOSIS — M5430 Sciatica, unspecified side: Secondary | ICD-10-CM | POA: Diagnosis not present

## 2019-02-08 DIAGNOSIS — M792 Neuralgia and neuritis, unspecified: Secondary | ICD-10-CM | POA: Diagnosis not present

## 2019-02-15 ENCOUNTER — Other Ambulatory Visit: Payer: Self-pay

## 2019-02-15 DIAGNOSIS — Z20822 Contact with and (suspected) exposure to covid-19: Secondary | ICD-10-CM

## 2019-02-17 LAB — NOVEL CORONAVIRUS, NAA: SARS-CoV-2, NAA: NOT DETECTED

## 2019-02-21 ENCOUNTER — Other Ambulatory Visit: Payer: Self-pay

## 2019-02-21 DIAGNOSIS — Z20822 Contact with and (suspected) exposure to covid-19: Secondary | ICD-10-CM

## 2019-02-22 LAB — NOVEL CORONAVIRUS, NAA: SARS-CoV-2, NAA: NOT DETECTED

## 2019-02-26 ENCOUNTER — Other Ambulatory Visit: Payer: Self-pay | Admitting: Physician Assistant

## 2019-02-26 DIAGNOSIS — F411 Generalized anxiety disorder: Secondary | ICD-10-CM

## 2019-02-28 ENCOUNTER — Other Ambulatory Visit: Payer: Self-pay | Admitting: Otolaryngology

## 2019-02-28 ENCOUNTER — Other Ambulatory Visit: Payer: Self-pay

## 2019-02-28 ENCOUNTER — Other Ambulatory Visit
Admission: RE | Admit: 2019-02-28 | Discharge: 2019-02-28 | Disposition: A | Discharge status: Home / Self Care | Payer: BC Managed Care – PPO | Source: Ambulatory Visit | Attending: Otolaryngology | Admitting: Otolaryngology

## 2019-02-28 ENCOUNTER — Ambulatory Visit
Admission: RE | Admit: 2019-02-28 | Discharge: 2019-02-28 | Disposition: A | Discharge status: Home / Self Care | Payer: BC Managed Care – PPO | Source: Ambulatory Visit | Attending: Otolaryngology | Admitting: Otolaryngology

## 2019-02-28 DIAGNOSIS — R0602 Shortness of breath: Secondary | ICD-10-CM | POA: Diagnosis not present

## 2019-02-28 DIAGNOSIS — R05 Cough: Secondary | ICD-10-CM | POA: Diagnosis not present

## 2019-02-28 DIAGNOSIS — R059 Cough, unspecified: Secondary | ICD-10-CM

## 2019-03-03 ENCOUNTER — Other Ambulatory Visit: Payer: Self-pay

## 2019-03-03 DIAGNOSIS — Z20822 Contact with and (suspected) exposure to covid-19: Secondary | ICD-10-CM

## 2019-03-06 LAB — NOVEL CORONAVIRUS, NAA: SARS-CoV-2, NAA: NOT DETECTED

## 2019-03-15 ENCOUNTER — Encounter: Payer: Self-pay | Admitting: Physician Assistant

## 2019-03-22 ENCOUNTER — Ambulatory Visit: Payer: Self-pay | Admitting: Physician Assistant

## 2019-03-22 ENCOUNTER — Ambulatory Visit (INDEPENDENT_AMBULATORY_CARE_PROVIDER_SITE_OTHER): Payer: BC Managed Care – PPO | Admitting: Physician Assistant

## 2019-03-22 DIAGNOSIS — J301 Allergic rhinitis due to pollen: Secondary | ICD-10-CM | POA: Diagnosis not present

## 2019-03-22 DIAGNOSIS — F419 Anxiety disorder, unspecified: Secondary | ICD-10-CM

## 2019-03-22 DIAGNOSIS — R0602 Shortness of breath: Secondary | ICD-10-CM | POA: Diagnosis not present

## 2019-03-22 DIAGNOSIS — K219 Gastro-esophageal reflux disease without esophagitis: Secondary | ICD-10-CM | POA: Diagnosis not present

## 2019-03-22 DIAGNOSIS — R05 Cough: Secondary | ICD-10-CM | POA: Diagnosis not present

## 2019-03-22 MED ORDER — HYDROXYZINE HCL 10 MG PO TABS: ORAL_TABLET | ORAL | 1 refills | Status: DC

## 2019-03-22 NOTE — Progress Notes (Signed)
Patient: Kevin Carpenter Male    DOB: September 04, 1955   63 y.o.   MRN: [id number] Visit Date: 03/22/2019  Today's Provider: Trinna Post, PA-C   Chief Complaint  Patient presents with  . Anxiety   Subjective:    Virtual Visit via Telephone Note  I connected with [name] on 03/22/19 at 10:40 AM EST by telephone and verified that I am speaking with the correct person using two identifiers.  Location: Patient: Home Provider: Office   I discussed the limitations, risks, security and privacy concerns of performing an evaluation and management service by telephone and the availability of in person appointments. I also discussed with the patient that there may be a patient responsible charge related to this service. The patient expressed understanding and agreed to proceed.  HPI  Anxiety Patient present today virtually for anxiety follow-up. Patient would like to started back on his Diazepam. Patient was started on zoloft several months ago which patient took for one week and discontinued due to intolerable side effects including jitteriness, jaw pain and headache. He has tried cymbalta which he reports caused fatigue. He reports increased stressors with work and family illness due to [place]. He is on chronic opioid therapy with pain management physician, Dr Primus Bravo. He is not interested in starting alternative maintenance medication.   GAD 7 : Generalized Anxiety Score 03/22/2019  Nervous, Anxious, on Edge 3  Control/stop worrying 3  Worry too much - different things 2  Trouble relaxing 3  Restless 3  Easily annoyed or irritable 0  Afraid - awful might happen 1  Total GAD 7 Score 15  Anxiety Difficulty Somewhat difficult    Allergies  Allergen Reactions  . Duloxetine     fatigue  . Gabapentin     "loopy"  . Sertraline     jitteriness     Current Outpatient Medications:  .  folic acid (FOLVITE) 1 MG tablet, Take 1 mg by mouth daily., Disp: , Rfl:  .   Glucosamine-Chondroitin 250-200 MG TABS, Take by mouth daily. , Disp: , Rfl:  .  HYDROcodone-acetaminophen (NORCO) 10-325 MG tablet, Take 1 tablet by mouth every 6 (six) hours as needed., Disp: , Rfl:  .  Misc Natural Products (OSTEO BI-FLEX ADV DOUBLE ST PO), Take by mouth., Disp: , Rfl:  .  Multiple Vitamin (MULTI-VITAMINS) TABS, Take by mouth daily. , Disp: , Rfl:  .  Omega-3 Fatty Acids (FISH OIL) 1200 MG CAPS, Take by mouth., Disp: , Rfl:  .  omeprazole (PRILOSEC) 40 MG capsule, Take 40 mg by mouth as needed. , Disp: , Rfl:  .  rivaroxaban (XARELTO) 20 MG TABS tablet, TAKE 1 TABLET BY MOUTH EVERY DAY, Disp: , Rfl:  .  hydrOXYzine (ATARAX/VISTARIL) 10 MG tablet, Use 1-2 pills three times daily as needed for anxiety., Disp: 30 tablet, Rfl: 1  Review of Systems  Social History   Tobacco Use  . Smoking status: Former Smoker    Packs/day: 1.00    Years: 16.00    Pack years: 16.00    Types: Cigarettes    Quit date: 03/31/1986    Years since quitting: 32.9  . Smokeless tobacco: Current User    Types: Chew  . Tobacco comment: quit smoking   Substance Use Topics  . Alcohol use: No    Alcohol/week: 0.0 standard drinks      Objective:   There were no vitals taken for this visit. There were no vitals filed  for this visit.There is no height or weight on file to calculate BMI.   Physical Exam   No results found for any visits on 03/22/19.     Assessment & Plan    1. Anxiety disorder, unspecified type  Discussed alternatives like buspar which is not an SSRI. Patient doesn't want to try anything aside from valium which has worked for him previously. Explained interaction between opioids and benzodiazepines even at prescribed doses. Suggest vistaril, patient agreeable and willing to try. Also suggest counseling which patient declines at this point.   - hydrOXYzine (ATARAX/VISTARIL) 10 MG tablet; Use 1-2 pills three times daily as needed for anxiety.  Dispense: 30 tablet; Refill: 1  I  discussed the assessment and treatment plan with the patient. The patient was provided an opportunity to ask questions and all were answered. The patient agreed with the plan and demonstrated an understanding of the instructions.   The patient was advised to call back or seek an in-person evaluation if the symptoms worsen or if the condition fails to improve as anticipated.  I provided 25 minutes of non-face-to-face time during this encounter.     Trinna Post, PA-C  [facility]

## 2019-03-22 NOTE — Patient Instructions (Signed)

## 2019-04-06 DIAGNOSIS — M792 Neuralgia and neuritis, unspecified: Secondary | ICD-10-CM | POA: Diagnosis not present

## 2019-04-06 DIAGNOSIS — G894 Chronic pain syndrome: Secondary | ICD-10-CM | POA: Diagnosis not present

## 2019-04-06 DIAGNOSIS — M5430 Sciatica, unspecified side: Secondary | ICD-10-CM | POA: Diagnosis not present

## 2019-05-23 ENCOUNTER — Other Ambulatory Visit: Payer: Self-pay | Admitting: Pulmonary Disease

## 2019-05-23 DIAGNOSIS — R0602 Shortness of breath: Secondary | ICD-10-CM

## 2019-05-31 ENCOUNTER — Other Ambulatory Visit: Payer: Self-pay | Admitting: Physician Assistant

## 2019-05-31 DIAGNOSIS — F411 Generalized anxiety disorder: Secondary | ICD-10-CM

## 2019-06-01 ENCOUNTER — Other Ambulatory Visit: Payer: Self-pay

## 2019-06-01 ENCOUNTER — Ambulatory Visit
Admission: RE | Admit: 2019-06-01 | Discharge: 2019-06-01 | Disposition: A | Discharge status: Home / Self Care | Payer: BC Managed Care – PPO | Source: Ambulatory Visit | Attending: Pulmonary Disease | Admitting: Pulmonary Disease

## 2019-06-01 DIAGNOSIS — J9811 Atelectasis: Secondary | ICD-10-CM | POA: Diagnosis not present

## 2019-06-01 DIAGNOSIS — R0602 Shortness of breath: Secondary | ICD-10-CM | POA: Insufficient documentation

## 2019-06-01 DIAGNOSIS — I712 Thoracic aortic aneurysm, without rupture: Secondary | ICD-10-CM | POA: Diagnosis not present

## 2019-06-01 MED ORDER — IOHEXOL 300 MG/ML  SOLN
75.0000 mL | Freq: Once | INTRAMUSCULAR | Status: AC | PRN
  Administered 2019-06-01: 75 mL via INTRAVENOUS

## 2019-06-06 DIAGNOSIS — M48062 Spinal stenosis, lumbar region with neurogenic claudication: Secondary | ICD-10-CM | POA: Diagnosis not present

## 2019-06-06 DIAGNOSIS — R5382 Chronic fatigue, unspecified: Secondary | ICD-10-CM | POA: Diagnosis not present

## 2019-06-06 DIAGNOSIS — M79669 Pain in unspecified lower leg: Secondary | ICD-10-CM | POA: Diagnosis not present

## 2019-06-06 DIAGNOSIS — M47897 Other spondylosis, lumbosacral region: Secondary | ICD-10-CM | POA: Diagnosis not present

## 2019-06-06 DIAGNOSIS — G894 Chronic pain syndrome: Secondary | ICD-10-CM | POA: Diagnosis not present

## 2019-06-06 DIAGNOSIS — M5430 Sciatica, unspecified side: Secondary | ICD-10-CM | POA: Diagnosis not present

## 2019-06-06 DIAGNOSIS — M792 Neuralgia and neuritis, unspecified: Secondary | ICD-10-CM | POA: Diagnosis not present

## 2019-06-29 DIAGNOSIS — R399 Unspecified symptoms and signs involving the genitourinary system: Secondary | ICD-10-CM | POA: Diagnosis not present

## 2019-07-06 NOTE — Progress Notes (Signed)
Established patient visit      Patient: Kevin Carpenter   DOB: Sep 30, 1955   64 y.o. Male  MRN: [id number] Visit Date: 07/07/2019  Today's healthcare provider: Vernie Murders, PA  Subjective:    Abdominal Pain This is a new problem. Episode onset: 6 weeks ago. The problem has been gradually worsening. The pain is located in the LLQ (lower abdomen). The quality of the pain is dull. The abdominal pain radiates to the RLQ. Associated symptoms include constipation and hematuria (one single episode now resolved s/p urology workup). Pertinent negatives include no fever, nausea or vomiting. Associated symptoms comments: Bloated after eating small amounts of food. He has tried nothing for the symptoms.    Anxiety: Patient would like to discuss increased work stress and anxiety.     Patient Active Problem List   Diagnosis Date Noted  . Allergic rhinitis 06/20/2016  . Benign nodular prostatic hyperplasia without lower urinary tract symptoms 06/20/2016  . History of bradycardia 03/20/2015  . Mixed hyperlipidemia 03/20/2015  . Hx of pulmonary embolus 02/12/2015  . Brash 02/12/2015  . Pain in joint, ankle and foot 12/22/2014  . Plantar fasciitis, bilateral 09/26/2014  . Complex regional pain syndrome of both lower extremities 09/26/2014  . Aneurysm of aortic root ([place]) 02/28/2014  . Anxiety disorder 02/01/2014   Past Medical History:  Diagnosis Date  . Anxiety   . DVT (deep venous thrombosis) ([place])   . Hx of blood clots   . Plantar fasciitis, bilateral   . Pulmonary embolism ([place])   . Reflux    Social History   Tobacco Use  . Smoking status: Former Smoker    Packs/day: 1.00    Years: 16.00    Pack years: 16.00    Types: Cigarettes    Quit date: 03/31/1986    Years since quitting: 33.2  . Smokeless tobacco: Current User    Types: Chew  . Tobacco comment: quit smoking   Substance Use Topics  . Alcohol use: No    Alcohol/week: 0.0 standard drinks  . Drug use: No    Family  History  Problem Relation Age of Onset  . Heart disease Mother   . Atrial fibrillation Mother   . Hearing loss Father   . Healthy Father   . Healthy Sister   . Healthy Daughter   . Heart Problems Maternal Uncle    Allergies  Allergen Reactions  . Duloxetine     fatigue  . Gabapentin     "loopy"  . Sertraline     jitteriness       Medications: Outpatient Medications Prior to Visit  Medication Sig  . folic acid (FOLVITE) 1 MG tablet Take 1 mg by mouth daily.  . Glucosamine-Chondroitin 250-200 MG TABS Take by mouth daily.   Marland Kitchen HYDROcodone-acetaminophen (NORCO) 10-325 MG tablet Take 1 tablet by mouth every 6 (six) hours as needed.  . hydrOXYzine (ATARAX/VISTARIL) 10 MG tablet Use 1-2 pills three times daily as needed for anxiety.  . Misc Natural Products (OSTEO BI-FLEX ADV DOUBLE ST PO) Take by mouth.  . Multiple Vitamin (MULTI-VITAMINS) TABS Take by mouth daily.   . Omega-3 Fatty Acids (FISH OIL) 1200 MG CAPS Take by mouth.  Marland Kitchen omeprazole (PRILOSEC) 40 MG capsule Take 40 mg by mouth as needed.   . rivaroxaban (XARELTO) 20 MG TABS tablet TAKE 1 TABLET BY MOUTH EVERY DAY   No facility-administered medications prior to visit.    Review of Systems  Constitutional: Negative for appetite  change, chills and fever.  Respiratory: Negative for chest tightness, shortness of breath and wheezing.   Cardiovascular: Negative for chest pain and palpitations.  Gastrointestinal: Positive for abdominal distention, abdominal pain and constipation. Negative for nausea and vomiting.  Genitourinary: Positive for hematuria (one single episode now resolved s/p urology workup).  Psychiatric/Behavioral: Positive for agitation. The patient is nervous/anxious.     GAD 7 : Generalized Anxiety Score 07/07/2019 03/22/2019  Nervous, Anxious, on Edge 3 3  Control/stop worrying 1 3  Worry too much - different things 3 2  Trouble relaxing 1 3  Restless 1 3  Easily annoyed or irritable 3 0  Afraid - awful  might happen 1 1  Total GAD 7 Score 13 15  Anxiety Difficulty Somewhat difficult Somewhat difficult        Objective:    BP 126/84 (BP Location: Left Arm, Patient Position: Sitting, Cuff Size: Normal)   Pulse 69   Temp (!) 97.1 F (36.2 C) (Temporal)   Resp 16   Wt 201 lb (91.2 kg)   BMI 26.52 kg/m    Physical Exam Constitutional:      General: He is not in acute distress.    Appearance: He is well-developed.  HENT:     Head: Normocephalic and atraumatic.     Right Ear: Hearing normal.     Left Ear: Hearing normal.     Nose: Nose normal.  Eyes:     General: Lids are normal. No scleral icterus.       Right eye: No discharge.        Left eye: No discharge.     Conjunctiva/sclera: Conjunctivae normal.  Cardiovascular:     Rate and Rhythm: Normal rate.     Heart sounds: Normal heart sounds.  Pulmonary:     Effort: Pulmonary effort is normal. No respiratory distress.  Abdominal:     General: Bowel sounds are normal.  Musculoskeletal:        General: Normal range of motion.     Cervical back: Neck supple.  Skin:    Findings: No lesion or rash.  Neurological:     Mental Status: He is alert and oriented to person, place, and time.  Psychiatric:        Attention and Perception: Attention and perception normal.        Mood and Affect: Mood is anxious.        Speech: Speech normal.        Behavior: Behavior normal.        Thought Content: Thought content normal.        Assessment & Plan:    1. Generalized anxiety disorder Has had anxiety without significant depressive symptoms intermittently for years. No relief from Hydroxyzine. Was treated in the past with Diazepam for a couple weeks then only one tablet if nervousness recurred. Some GI symptoms suspected related to stress at work. Has had extra responsibilities added and not sure he will be able to handle the extra work stress. Will discontinue Hydroxyzine and give a short course of the Diazepam. Check routine labs  for metabolic disorders or thyroid disease. Recheck prn reports. - CBC with Differential/Platelet - Comprehensive metabolic panel - TSH - diazepam (VALIUM) 5 MG tablet; Take 0.5 tablets (2.5 mg total) by mouth every 12 (twelve) hours as needed for anxiety.  Dispense: 30 tablet; Refill: 1  2. Lower abdominal pain Intermittent bilateral lower abdomen discomfort into groin over the past couple months. Irregular BM's without hematemesis, hematochezia,  melena, nausea or vomiting. No dysuria. Has had urology work-up without significant pathology. Urologist (Dr. Amalia Hailey) felt is may have been due to use of Fish Oil with Xarelto. No further blood in urine or significant back pain. Increase fluid intake and will treat for suspected spastic colon due to anxiety disorder. - CBC with Differential/Platelet - Comprehensive metabolic panel      Vernie Murders, [place] [phone] (phone) [phone] (fax)  [place]

## 2019-07-07 ENCOUNTER — Encounter: Payer: Self-pay | Admitting: Family Medicine

## 2019-07-07 ENCOUNTER — Ambulatory Visit: Payer: BC Managed Care – PPO | Admitting: Family Medicine

## 2019-07-07 ENCOUNTER — Other Ambulatory Visit: Payer: Self-pay

## 2019-07-07 VITALS — BP 126/84 | HR 69 | Temp 97.1°F | Resp 16 | Wt 201.0 lb

## 2019-07-07 DIAGNOSIS — R1030 Lower abdominal pain, unspecified: Secondary | ICD-10-CM

## 2019-07-07 DIAGNOSIS — F411 Generalized anxiety disorder: Secondary | ICD-10-CM | POA: Diagnosis not present

## 2019-07-07 MED ORDER — DIAZEPAM 5 MG PO TABS: 2.5000 mg | ORAL_TABLET | Freq: Two times a day (BID) | ORAL | 1 refills | Status: AC | PRN

## 2019-07-07 NOTE — Patient Instructions (Signed)

## 2019-07-08 ENCOUNTER — Telehealth: Payer: Self-pay | Admitting: *Deleted

## 2019-07-08 LAB — CBC WITH DIFFERENTIAL/PLATELET
Basophils Absolute: 0 10*3/uL (ref 0.0–0.2)
Basos: 1 %
EOS (ABSOLUTE): 0 10*3/uL (ref 0.0–0.4)
Eos: 0 %
Hematocrit: 47.6 % (ref 37.5–51.0)
Hemoglobin: 15.7 g/dL (ref 13.0–17.7)
Immature Grans (Abs): 0 10*3/uL (ref 0.0–0.1)
Immature Granulocytes: 0 %
Lymphocytes Absolute: 1.7 10*3/uL (ref 0.7–3.1)
Lymphs: 28 %
MCH: 29.7 pg (ref 26.6–33.0)
MCHC: 33 g/dL (ref 31.5–35.7)
MCV: 90 fL (ref 79–97)
Monocytes Absolute: 0.3 10*3/uL (ref 0.1–0.9)
Monocytes: 5 %
Neutrophils Absolute: 3.9 10*3/uL (ref 1.4–7.0)
Neutrophils: 66 %
Platelets: 221 10*3/uL (ref 150–450)
RBC: 5.29 x10E6/uL (ref 4.14–5.80)
RDW: 12.4 % (ref 11.6–15.4)
WBC: 6 10*3/uL (ref 3.4–10.8)

## 2019-07-08 LAB — COMPREHENSIVE METABOLIC PANEL
ALT: 24 IU/L (ref 0–44)
AST: 23 IU/L (ref 0–40)
Albumin/Globulin Ratio: 2 (ref 1.2–2.2)
Albumin: 4.4 g/dL (ref 3.8–4.8)
Alkaline Phosphatase: 89 IU/L (ref 39–117)
BUN/Creatinine Ratio: 11 (ref 10–24)
BUN: 11 mg/dL (ref 8–27)
Bilirubin Total: 0.5 mg/dL (ref 0.0–1.2)
CO2: 25 mmol/L (ref 20–29)
Calcium: 9.5 mg/dL (ref 8.6–10.2)
Chloride: 103 mmol/L (ref 96–106)
Creatinine, Ser: 1 mg/dL (ref 0.76–1.27)
GFR calc Af Amer: 92 mL/min/{1.73_m2} (ref >59)
GFR calc non Af Amer: 80 mL/min/{1.73_m2} (ref >59)
Globulin, Total: 2.2 g/dL (ref 1.5–4.5)
Glucose: 105 mg/dL — ABNORMAL HIGH (ref 65–99)
Potassium: 4.6 mmol/L (ref 3.5–5.2)
Sodium: 140 mmol/L (ref 134–144)
Total Protein: 6.6 g/dL (ref 6.0–8.5)

## 2019-07-08 LAB — TSH: TSH: 1.79 u[IU]/mL (ref 0.450–4.500)

## 2019-07-08 NOTE — Telephone Encounter (Signed)
Patients wife called and she was read message from [place] PA 07/08/19. Appointment was scheduled per the note. She verbalized understanding of all information.

## 2019-07-08 NOTE — Telephone Encounter (Signed)
LMOVM for pt to return call. Okay for PEC triage to give patient results.  

## 2019-07-08 NOTE — Telephone Encounter (Signed)
-----   Message from Margo Common, [place] sent at 07/08/2019  8:35 AM EDT ----- All blood tests are normal. No sign of liver, thyroid or kidney disease. No sign of infection or anemia. Probably having physical manifestations of generalized anxiety. Don't use the Diazepam on the day you have to use any Norco. Proceed with probiotic supplement. If this does not calm down the abdominal discomfort, may have to switch to Bentyl. Recheck appointment in a month to assess progress.

## 2019-07-21 DIAGNOSIS — R0600 Dyspnea, unspecified: Secondary | ICD-10-CM | POA: Diagnosis not present

## 2019-07-21 DIAGNOSIS — Z87891 Personal history of nicotine dependence: Secondary | ICD-10-CM | POA: Diagnosis not present

## 2019-07-21 DIAGNOSIS — Z01818 Encounter for other preprocedural examination: Secondary | ICD-10-CM | POA: Diagnosis not present

## 2019-07-27 DIAGNOSIS — R0600 Dyspnea, unspecified: Secondary | ICD-10-CM | POA: Diagnosis not present

## 2019-08-01 ENCOUNTER — Encounter: Payer: Self-pay | Admitting: Family Medicine

## 2019-08-01 DIAGNOSIS — M792 Neuralgia and neuritis, unspecified: Secondary | ICD-10-CM | POA: Diagnosis not present

## 2019-08-01 DIAGNOSIS — M5430 Sciatica, unspecified side: Secondary | ICD-10-CM | POA: Diagnosis not present

## 2019-08-01 DIAGNOSIS — G894 Chronic pain syndrome: Secondary | ICD-10-CM | POA: Diagnosis not present

## 2019-08-02 ENCOUNTER — Encounter: Payer: Self-pay | Admitting: Family Medicine

## 2019-08-02 NOTE — Progress Notes (Signed)
Established patient visit   Patient: Kevin Carpenter   DOB: [birth year]   64 y.o. Male  MRN: [id number] Visit Date: 08/08/2019  Today's healthcare provider: Vernie Murders, PA   Chief Complaint  Patient presents with  . Anxiety   Subjective    HPI  Anxiety, Follow-up  He was last seen for anxiety on 07/07/19. Changes made at last visit include changed from Hydroxyzine back to Diazepam.  However his pain management provider took him off of the Diazepam.  He states he is feeling better than he was but still not where he would like to be.     GAD-7 Results GAD-7 Generalized Anxiety Disorder Screening Tool 08/08/2019 07/07/2019 03/22/2019  1. Feeling Nervous, Anxious, or on Edge 1 3 3   2. Not Being Able to Stop or Control Worrying 0 1 3  3. Worrying Too Much About Different Things 0 3 2  4. Trouble Relaxing 0 1 3  5. Being So Restless it's Hard To Sit Still 0 1 3  6. Becoming Easily Annoyed or Irritable 1 3 0  7. Feeling Afraid As If Something Awful Might Happen 0 1 1  Total GAD-7 Score 2 13 15   Difficulty At Work, Home, or Getting  Along With Others? - Somewhat difficult Somewhat difficult    PHQ-9 Scores PHQ9 SCORE ONLY 08/08/2019 10/15/2018 06/26/2017  Score 3 0 2    ---------------------------------------------------------------------------------------------------   Past Medical History:  Diagnosis Date  . Anxiety   . DVT (deep venous thrombosis) ([place])   . Hx of blood clots   . Plantar fasciitis, bilateral   . Pulmonary embolism ([place])   . Reflux    Social History   Tobacco Use  . Smoking status: Former Smoker    Packs/day: 1.00    Years: 16.00    Pack years: 16.00    Types: Cigarettes    Quit date: 03/31/1986    Years since quitting: 33.3  . Smokeless tobacco: Current User    Types: Chew  . Tobacco comment: quit smoking   Substance Use Topics  . Alcohol use: No    Alcohol/week: 0.0 standard drinks  . Drug use: No   Family Status  Relation Name Status  .  Mother  Alive  . Father  Alive  . Sister  Alive  . Daughter stepchild Alive  . Mat Uncle  (Not Specified)   Allergies  Allergen Reactions  . Duloxetine     fatigue  . Gabapentin     "loopy"  . Sertraline     jitteriness   Medications: Outpatient Medications Prior to Visit  Medication Sig  . folic acid (FOLVITE) 1 MG tablet Take 1 mg by mouth daily.  . Glucosamine-Chondroitin 250-200 MG TABS Take by mouth daily.   Marland Kitchen HYDROcodone-acetaminophen (NORCO) 10-325 MG tablet Take 1 tablet by mouth every 6 (six) hours as needed.  . Misc Natural Products (OSTEO BI-FLEX ADV DOUBLE ST PO) Take by mouth.  . Multiple Vitamin (MULTI-VITAMINS) TABS Take by mouth daily.   . Omega-3 Fatty Acids (FISH OIL) 1200 MG CAPS Take by mouth.  Marland Kitchen omeprazole (PRILOSEC) 40 MG capsule Take 40 mg by mouth as needed.   . rivaroxaban (XARELTO) 20 MG TABS tablet TAKE 1 TABLET BY MOUTH EVERY DAY  . diazepam (VALIUM) 5 MG tablet Take 0.5 tablets (2.5 mg total) by mouth every 12 (twelve) hours as needed for anxiety. (Patient not taking: Reported on 08/08/2019)   No facility-administered medications prior to visit.  Review of Systems  Constitutional: Negative for fatigue and fever.  Respiratory: Negative for shortness of breath.   Cardiovascular: Negative for chest pain, palpitations and leg swelling.  Musculoskeletal: Negative for myalgias.  Neurological: Negative for dizziness and headaches.  Psychiatric/Behavioral: Negative for agitation, behavioral problems, confusion, decreased concentration, dysphoric mood, hallucinations, self-injury, sleep disturbance and suicidal ideas. The patient is not nervous/anxious and is not hyperactive.       Objective    BP 104/76 (BP Location: Right Arm, Patient Position: Sitting, Cuff Size: Normal)   Pulse 66   Temp (!) 96.9 F (36.1 C) (Skin)   Wt 202 lb (91.6 kg)   SpO2 99%   BMI 26.65 kg/m   Wt Readings from Last 3 Encounters:  08/08/19 202 lb (91.6 kg)  07/07/19  201 lb (91.2 kg)  10/15/18 200 lb (90.7 kg)      Physical Exam Constitutional:      General: He is not in acute distress.    Appearance: He is well-developed.  HENT:     Head: Normocephalic and atraumatic.     Right Ear: Hearing normal.     Left Ear: Hearing normal.     Nose: Nose normal.  Eyes:     General: Lids are normal. No scleral icterus.       Right eye: No discharge.        Left eye: No discharge.     Conjunctiva/sclera: Conjunctivae normal.  Cardiovascular:     Rate and Rhythm: Normal rate.     Heart sounds: Normal heart sounds.  Pulmonary:     Effort: Pulmonary effort is normal. No respiratory distress.     Breath sounds: Normal breath sounds.  Abdominal:     General: Bowel sounds are normal.     Palpations: Abdomen is soft.  Musculoskeletal:        General: Normal range of motion.     Cervical back: Neck supple.  Skin:    Findings: No lesion or rash.  Neurological:     Mental Status: He is alert and oriented to person, place, and time.  Psychiatric:        Speech: Speech normal.        Behavior: Behavior normal.        Thought Content: Thought content normal.      Fall Risk  08/08/2019 06/20/2016 12/10/2015 11/13/2015 10/16/2015  Falls in the past year? 0 No No (No Data) No  Comment - - - no since last visit -  Risk for fall due to : - - - - -  Risk for fall due to: Comment - - - - -    Functional Status Survey: Is the patient deaf or have difficulty hearing?: Yes Does the patient have difficulty seeing, even when wearing glasses/contacts?: No Does the patient have difficulty concentrating, remembering, or making decisions?: Yes Does the patient have difficulty walking or climbing stairs?: No Does the patient have difficulty dressing or bathing?: No Does the patient have difficulty doing errands alone such as visiting a doctor's office or shopping?: No   Office Visit from 10/15/2018 in [facility]  AUDIT-C Score  0       Assessment &  Plan     1. Generalized anxiety disorder Stopped the Diazepam due to concern of interaction with Norco. Sent letter to pain management (Dr. Primus Bravo) to discontinue any refills of Diazepam. Will give trial of Paroxetine 10 mg qd. Feels management of stress from work is improved. Willing to be  evaluated by a psychiatrist if no improvement with Paroxetine. - PARoxetine (PAXIL) 10 MG tablet; Take 1 tablet (10 mg total) by mouth daily.  Dispense: 30 tablet; Refill: 3  2. Colon cancer screening Last colonoscopy by Dr. Vira Agar (GI) showed only one sessile benign polyp. Due for repeat colonoscopy. - Ambulatory referral to Gastroenterology       I, Vernie Murders, PA, have reviewed all documentation for this visit. The documentation on 08/08/19 for the exam, diagnosis, procedures, and orders are all accurate and complete.    Vernie Murders, [place] [phone] (phone) [phone] (fax)  [place]

## 2019-08-02 NOTE — Progress Notes (Signed)
Patient sent a message that Dr. Primus Bravo (pain management) requested a letter documenting discontinuation of Diazepam prescribing from this office. Letter sent.

## 2019-08-08 ENCOUNTER — Encounter: Payer: Self-pay | Admitting: Family Medicine

## 2019-08-08 ENCOUNTER — Other Ambulatory Visit: Payer: Self-pay

## 2019-08-08 ENCOUNTER — Ambulatory Visit: Payer: BC Managed Care – PPO | Admitting: Family Medicine

## 2019-08-08 VITALS — BP 104/76 | HR 66 | Temp 96.9°F | Wt 202.0 lb

## 2019-08-08 DIAGNOSIS — Z1211 Encounter for screening for malignant neoplasm of colon: Secondary | ICD-10-CM

## 2019-08-08 DIAGNOSIS — F411 Generalized anxiety disorder: Secondary | ICD-10-CM

## 2019-08-08 MED ORDER — PAROXETINE HCL 10 MG PO TABS: 10.0000 mg | ORAL_TABLET | Freq: Every day | ORAL | 3 refills | Status: DC

## 2019-08-08 NOTE — Assessment & Plan Note (Signed)
Patient followed by pain clinic and they have taken him off of his Diazepam.

## 2019-08-31 ENCOUNTER — Other Ambulatory Visit: Payer: Self-pay | Admitting: Family Medicine

## 2019-08-31 DIAGNOSIS — F411 Generalized anxiety disorder: Secondary | ICD-10-CM

## 2019-08-31 NOTE — Telephone Encounter (Signed)
Requested Prescriptions  Pending Prescriptions Disp Refills   PARoxetine (PAXIL) 10 MG tablet [Pharmacy Med Name: PAROXETINE HCL 10 MG TABLET] 90 tablet 0    Sig: TAKE 1 TABLET BY MOUTH EVERY DAY     Psychiatry:  Antidepressants - SSRI Passed - 08/31/2019 11:32 AM      Passed - Valid encounter within last 6 months    Recent Outpatient Visits          3 weeks ago Generalized anxiety disorder   [place], [place]   1 month ago Generalized anxiety disorder   [facility], [place], [place]   5 months ago Anxiety disorder, unspecified type   [facility] [place], [place], PA-C   10 months ago Annual physical exam   [facility] [place], Wendee Beavers, [place]   2 years ago Annual physical exam   [place], [place]

## 2019-10-04 DIAGNOSIS — Z79891 Long term (current) use of opiate analgesic: Secondary | ICD-10-CM | POA: Diagnosis not present

## 2019-10-04 DIAGNOSIS — M47897 Other spondylosis, lumbosacral region: Secondary | ICD-10-CM | POA: Diagnosis not present

## 2019-10-04 DIAGNOSIS — R5382 Chronic fatigue, unspecified: Secondary | ICD-10-CM | POA: Diagnosis not present

## 2019-10-04 DIAGNOSIS — M722 Plantar fascial fibromatosis: Secondary | ICD-10-CM | POA: Diagnosis not present

## 2019-10-04 DIAGNOSIS — M792 Neuralgia and neuritis, unspecified: Secondary | ICD-10-CM | POA: Diagnosis not present

## 2019-10-04 DIAGNOSIS — M48062 Spinal stenosis, lumbar region with neurogenic claudication: Secondary | ICD-10-CM | POA: Diagnosis not present

## 2019-10-04 DIAGNOSIS — G8929 Other chronic pain: Secondary | ICD-10-CM | POA: Diagnosis not present

## 2019-10-04 DIAGNOSIS — M79669 Pain in unspecified lower leg: Secondary | ICD-10-CM | POA: Diagnosis not present

## 2019-10-04 DIAGNOSIS — G894 Chronic pain syndrome: Secondary | ICD-10-CM | POA: Diagnosis not present

## 2019-10-10 DIAGNOSIS — D6859 Other primary thrombophilia: Secondary | ICD-10-CM | POA: Diagnosis not present

## 2019-10-17 ENCOUNTER — Encounter: Payer: Self-pay | Admitting: Physician Assistant

## 2019-11-04 DIAGNOSIS — Z1159 Encounter for screening for other viral diseases: Secondary | ICD-10-CM | POA: Diagnosis not present

## 2019-11-07 DIAGNOSIS — D123 Benign neoplasm of transverse colon: Secondary | ICD-10-CM | POA: Diagnosis not present

## 2019-11-07 DIAGNOSIS — K5730 Diverticulosis of large intestine without perforation or abscess without bleeding: Secondary | ICD-10-CM | POA: Diagnosis not present

## 2019-11-07 DIAGNOSIS — D120 Benign neoplasm of cecum: Secondary | ICD-10-CM | POA: Diagnosis not present

## 2019-11-07 DIAGNOSIS — Z86010 Personal history of colonic polyps: Secondary | ICD-10-CM | POA: Diagnosis not present

## 2019-11-07 LAB — HM COLONOSCOPY

## 2019-11-18 DIAGNOSIS — D126 Benign neoplasm of colon, unspecified: Secondary | ICD-10-CM | POA: Insufficient documentation

## 2019-12-04 ENCOUNTER — Other Ambulatory Visit: Payer: Self-pay | Admitting: Physician Assistant

## 2019-12-04 DIAGNOSIS — F411 Generalized anxiety disorder: Secondary | ICD-10-CM

## 2019-12-04 NOTE — Telephone Encounter (Signed)
Requested Prescriptions  Pending Prescriptions Disp Refills  . PARoxetine (PAXIL) 10 MG tablet [Pharmacy Med Name: PAROXETINE HCL 10 MG TABLET] 90 tablet 0    Sig: TAKE 1 TABLET BY MOUTH EVERY DAY     Psychiatry:  Antidepressants - SSRI Passed - 12/04/2019  9:10 AM      Passed - Valid encounter within last 6 months    Recent Outpatient Visits          3 months ago Generalized anxiety disorder   [place], [place]   5 months ago Generalized anxiety disorder   [facility], [place], [place]   8 months ago Anxiety disorder, unspecified type   [facility] [place], Wendee Beavers, PA-C   1 year ago Annual physical exam   [facility] [place], [place]   2 years ago Annual physical exam   [place], [place]

## 2019-12-06 DIAGNOSIS — G894 Chronic pain syndrome: Secondary | ICD-10-CM | POA: Diagnosis not present

## 2019-12-06 DIAGNOSIS — M722 Plantar fascial fibromatosis: Secondary | ICD-10-CM | POA: Diagnosis not present

## 2019-12-06 DIAGNOSIS — M5430 Sciatica, unspecified side: Secondary | ICD-10-CM | POA: Diagnosis not present

## 2019-12-06 DIAGNOSIS — M792 Neuralgia and neuritis, unspecified: Secondary | ICD-10-CM | POA: Diagnosis not present

## 2019-12-21 ENCOUNTER — Ambulatory Visit: Payer: BC Managed Care – PPO | Admitting: Dermatology

## 2019-12-21 ENCOUNTER — Encounter: Payer: Self-pay | Admitting: Dermatology

## 2019-12-21 ENCOUNTER — Other Ambulatory Visit: Payer: Self-pay

## 2019-12-21 DIAGNOSIS — L821 Other seborrheic keratosis: Secondary | ICD-10-CM

## 2019-12-21 DIAGNOSIS — D1800 Hemangioma unspecified site: Secondary | ICD-10-CM | POA: Diagnosis not present

## 2019-12-21 DIAGNOSIS — L814 Other melanin hyperpigmentation: Secondary | ICD-10-CM

## 2019-12-21 DIAGNOSIS — Z85828 Personal history of other malignant neoplasm of skin: Secondary | ICD-10-CM

## 2019-12-21 DIAGNOSIS — L578 Other skin changes due to chronic exposure to nonionizing radiation: Secondary | ICD-10-CM

## 2019-12-21 DIAGNOSIS — D229 Melanocytic nevi, unspecified: Secondary | ICD-10-CM | POA: Diagnosis not present

## 2019-12-21 DIAGNOSIS — Z1283 Encounter for screening for malignant neoplasm of skin: Secondary | ICD-10-CM | POA: Diagnosis not present

## 2019-12-21 DIAGNOSIS — D1779 Benign lipomatous neoplasm of other sites: Secondary | ICD-10-CM

## 2019-12-21 NOTE — Progress Notes (Signed)
   Follow-Up Visit   Subjective  Kevin Carpenter is a 64 y.o. male who presents for the following: Annual Exam (Hx BCC - R nasal ala ). The patient presents for Total-Body Skin Exam (TBSE) for skin cancer screening and mole check.  The following portions of the chart were reviewed this encounter and updated as appropriate:  Tobacco  Allergies  Meds  Problems  Med Hx  Surg Hx  Fam Hx     Review of Systems:  No other skin or systemic complaints except as noted in HPI or Assessment and Plan.  Objective  Well appearing patient in no apparent distress; mood and affect are within normal limits.  A full examination was performed including scalp, head, eyes, ears, nose, lips, neck, chest, axillae, abdomen, back, buttocks, bilateral upper extremities, bilateral lower extremities, hands, feet, fingers, toes, fingernails, and toenails. All findings within normal limits unless otherwise noted below.  Objective  Left Upper Back: 9.0 cm fatty SQ nodule   Assessment & Plan  Lipoma of other specified sites Left Upper Back 9 cm Benign, discussed excision. No change in size, asymptomatic, observe.   Lentigines - Scattered tan macules - Discussed due to sun exposure - Benign, observe - Call for any changes  Seborrheic Keratoses - Stuck-on, waxy, tan-brown papules and plaques  - Discussed benign etiology and prognosis. - Observe - Call for any changes  Melanocytic Nevi - Tan-brown and/or pink-flesh-colored symmetric macules and papules - Benign appearing on exam today - Observation - Call clinic for new or changing moles - Recommend daily use of broad spectrum spf 30+ sunscreen to sun-exposed areas.   Hemangiomas - Red papules - Discussed benign nature - Observe - Call for any changes  Actinic Damage - diffuse scaly erythematous macules with underlying dyspigmentation - Recommend daily broad spectrum sunscreen SPF 30+ to sun-exposed areas, reapply every 2 hours as needed.  - Call  for new or changing lesions.  History of Basal Cell Carcinoma of the Skin - R nasal ala (10/18/14) - No evidence of recurrence today - Recommend regular full body skin exams - Recommend daily broad spectrum sunscreen SPF 30+ to sun-exposed areas, reapply every 2 hours as needed.  - Call if any new or changing lesions are noted between office visits  Skin cancer screening performed today.  Return in about 1 year (around 12/20/2020) for TBSE.  Luther Redo, CMA, am acting as scribe for Sarina Ser, MD .  Documentation: I have reviewed the above documentation for accuracy and completeness, and I agree with the above.  Sarina Ser, MD

## 2019-12-22 ENCOUNTER — Encounter: Payer: Self-pay | Admitting: Dermatology

## 2019-12-28 DIAGNOSIS — Z23 Encounter for immunization: Secondary | ICD-10-CM | POA: Diagnosis not present

## 2019-12-28 DIAGNOSIS — I712 Thoracic aortic aneurysm, without rupture: Secondary | ICD-10-CM | POA: Diagnosis not present

## 2019-12-28 DIAGNOSIS — E782 Mixed hyperlipidemia: Secondary | ICD-10-CM | POA: Diagnosis not present

## 2019-12-28 DIAGNOSIS — I2699 Other pulmonary embolism without acute cor pulmonale: Secondary | ICD-10-CM | POA: Diagnosis not present

## 2019-12-30 DIAGNOSIS — R1032 Left lower quadrant pain: Secondary | ICD-10-CM | POA: Diagnosis not present

## 2020-01-19 DIAGNOSIS — R972 Elevated prostate specific antigen [PSA]: Secondary | ICD-10-CM | POA: Diagnosis not present

## 2020-01-19 DIAGNOSIS — Z125 Encounter for screening for malignant neoplasm of prostate: Secondary | ICD-10-CM | POA: Diagnosis not present

## 2020-02-08 DIAGNOSIS — G894 Chronic pain syndrome: Secondary | ICD-10-CM | POA: Diagnosis not present

## 2020-02-08 DIAGNOSIS — M722 Plantar fascial fibromatosis: Secondary | ICD-10-CM | POA: Diagnosis not present

## 2020-02-08 DIAGNOSIS — Z79891 Long term (current) use of opiate analgesic: Secondary | ICD-10-CM | POA: Diagnosis not present

## 2020-04-11 DIAGNOSIS — M722 Plantar fascial fibromatosis: Secondary | ICD-10-CM | POA: Diagnosis not present

## 2020-04-11 DIAGNOSIS — M5430 Sciatica, unspecified side: Secondary | ICD-10-CM | POA: Diagnosis not present

## 2020-04-11 DIAGNOSIS — G894 Chronic pain syndrome: Secondary | ICD-10-CM | POA: Diagnosis not present

## 2020-04-11 DIAGNOSIS — Z79891 Long term (current) use of opiate analgesic: Secondary | ICD-10-CM | POA: Diagnosis not present

## 2020-04-13 IMAGING — CR DG CHEST 2V
1 series · 2 of 2 positions shown · non-contrast
Comparison: January 27, 2014

CLINICAL DATA: Cough; shortness of breath

EXAM:
CHEST - 2 VIEW

[Series 1: dg chest 2 view · 0.14mm/px · 2 of 2 slices shown]
[im 1/2]
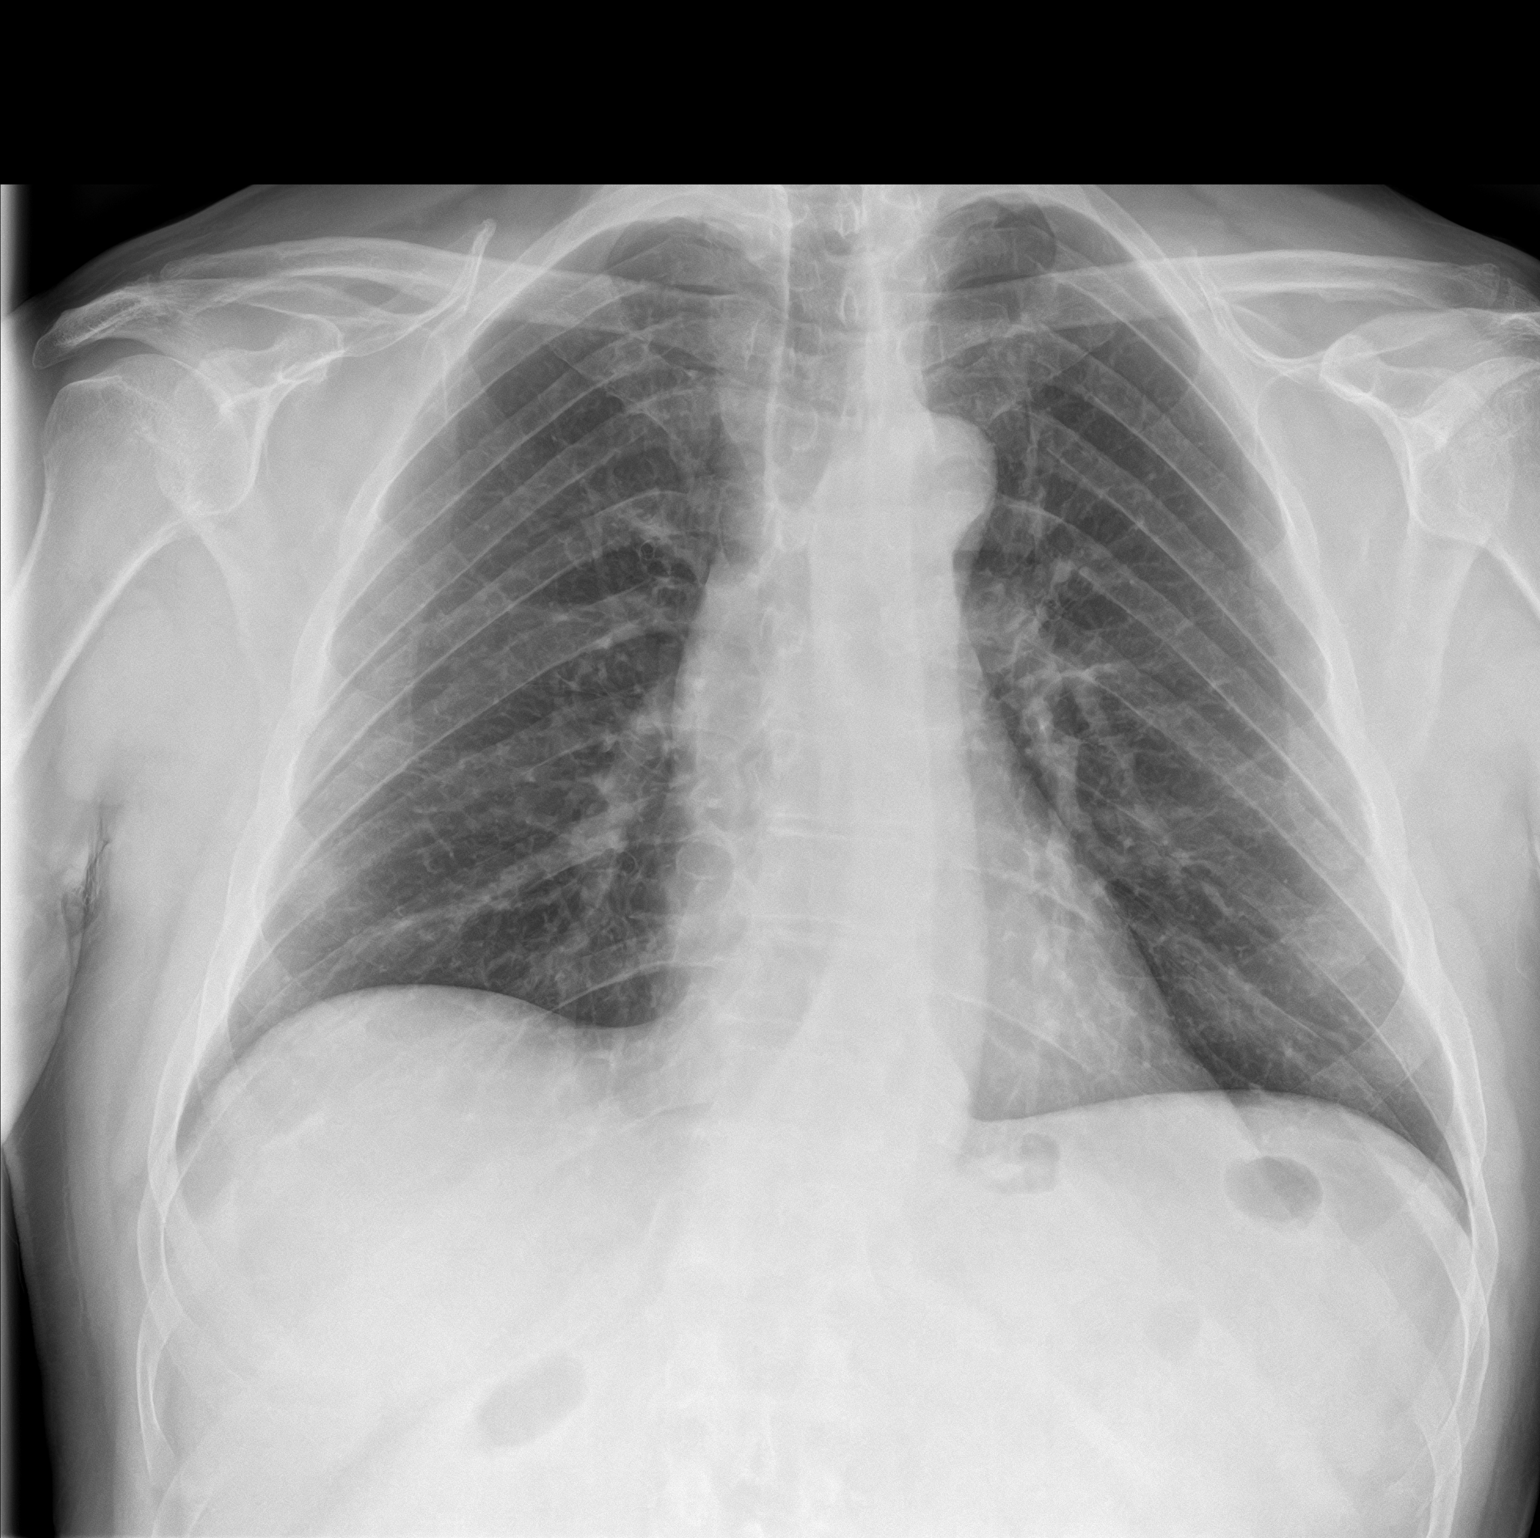
[im 2/2]
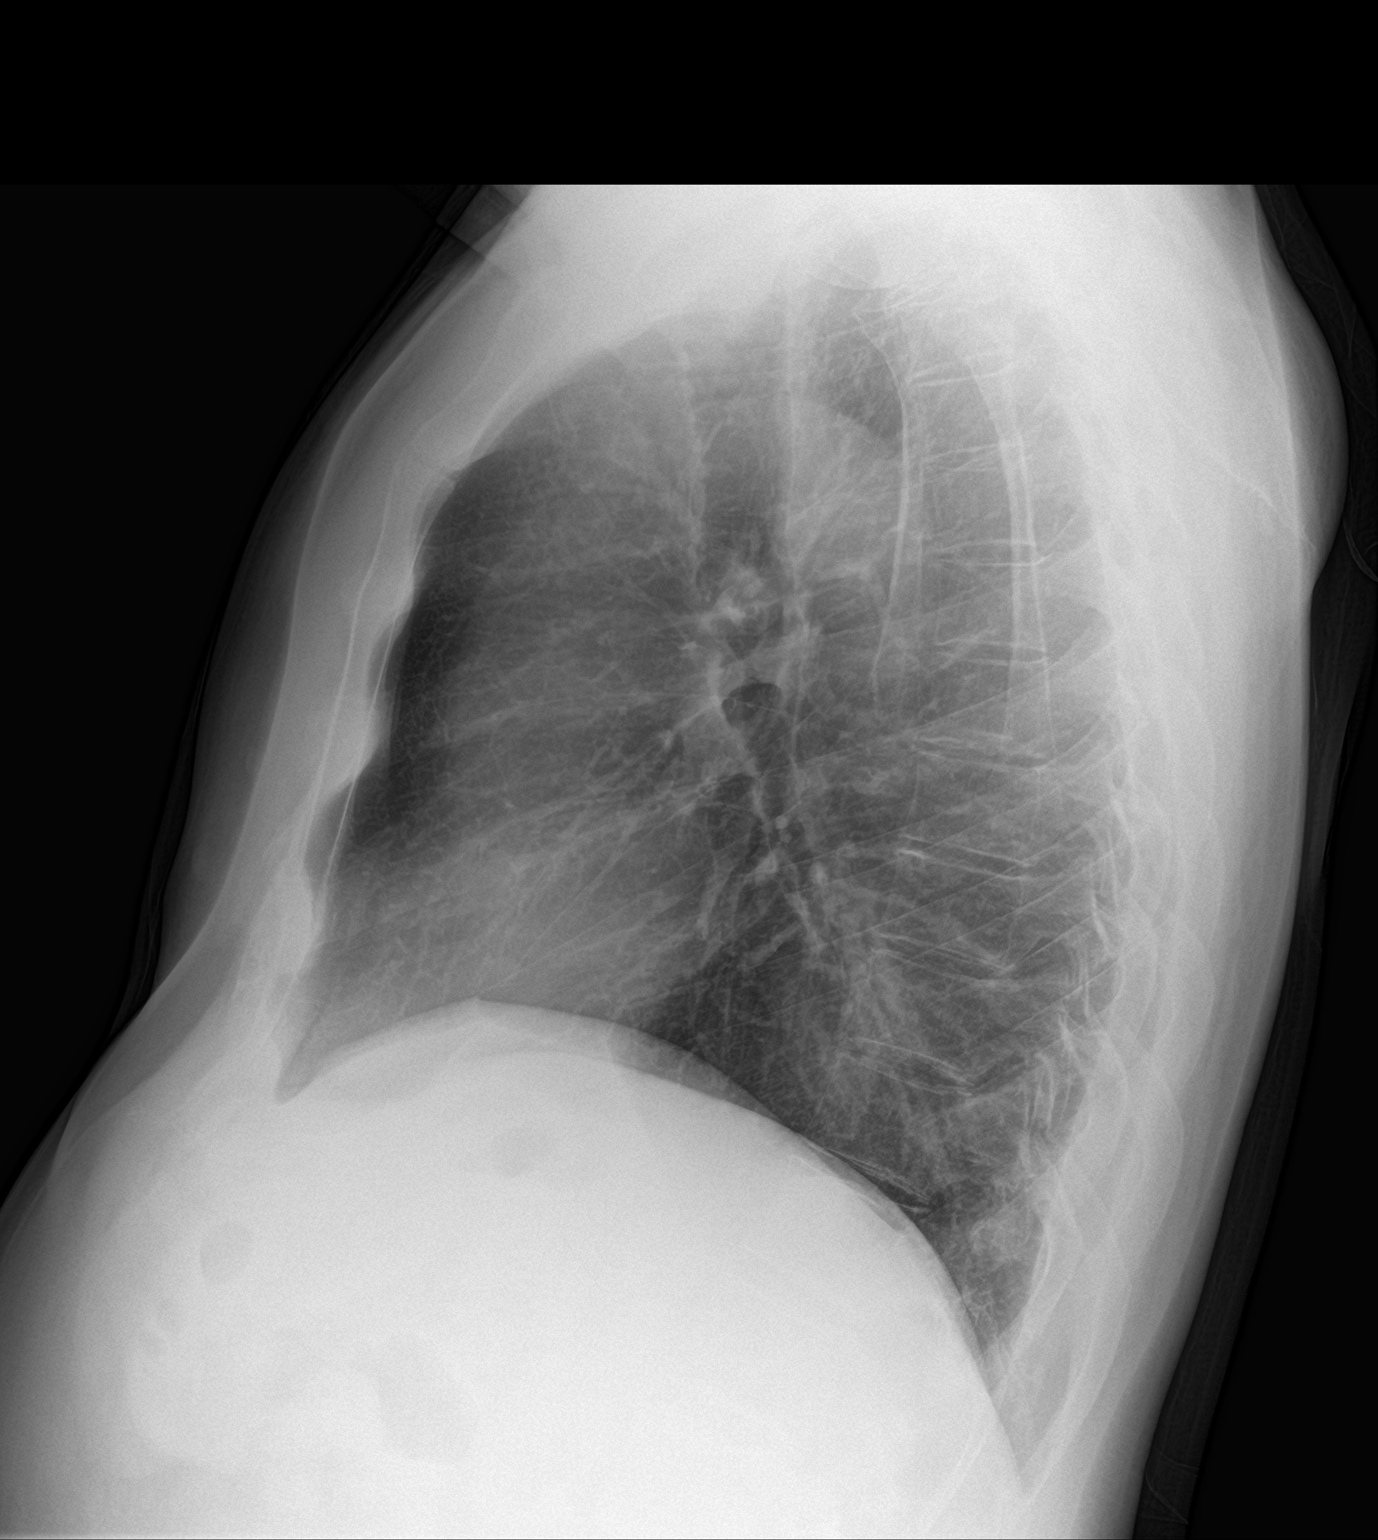

[2 of 2 positions shown; findings below may reference images not displayed]

FINDINGS: No edema or consolidation. Heart size and pulmonary vascularity are
normal. No adenopathy. There is a small hiatal hernia. No bone
lesions.
IMPRESSION: Small hiatal hernia. No edema or consolidation. Cardiac silhouette
within normal limits.

## 2020-05-11 ENCOUNTER — Encounter: Payer: BC Managed Care – PPO | Admitting: Physician Assistant

## 2020-06-06 DIAGNOSIS — G894 Chronic pain syndrome: Secondary | ICD-10-CM | POA: Diagnosis not present

## 2020-06-06 DIAGNOSIS — R5382 Chronic fatigue, unspecified: Secondary | ICD-10-CM | POA: Diagnosis not present

## 2020-06-06 DIAGNOSIS — M4807 Spinal stenosis, lumbosacral region: Secondary | ICD-10-CM | POA: Diagnosis not present

## 2020-06-06 DIAGNOSIS — M47897 Other spondylosis, lumbosacral region: Secondary | ICD-10-CM | POA: Diagnosis not present

## 2020-06-06 DIAGNOSIS — M792 Neuralgia and neuritis, unspecified: Secondary | ICD-10-CM | POA: Diagnosis not present

## 2020-06-06 DIAGNOSIS — M4726 Other spondylosis with radiculopathy, lumbar region: Secondary | ICD-10-CM | POA: Diagnosis not present

## 2020-06-06 DIAGNOSIS — M48062 Spinal stenosis, lumbar region with neurogenic claudication: Secondary | ICD-10-CM | POA: Diagnosis not present

## 2020-06-06 DIAGNOSIS — M79669 Pain in unspecified lower leg: Secondary | ICD-10-CM | POA: Diagnosis not present

## 2020-06-06 DIAGNOSIS — M5430 Sciatica, unspecified side: Secondary | ICD-10-CM | POA: Diagnosis not present

## 2020-06-06 DIAGNOSIS — G8929 Other chronic pain: Secondary | ICD-10-CM | POA: Diagnosis not present

## 2020-06-11 DIAGNOSIS — Z0000 Encounter for general adult medical examination without abnormal findings: Secondary | ICD-10-CM | POA: Diagnosis not present

## 2020-06-12 DIAGNOSIS — Z1389 Encounter for screening for other disorder: Secondary | ICD-10-CM | POA: Diagnosis not present

## 2020-06-12 DIAGNOSIS — Z125 Encounter for screening for malignant neoplasm of prostate: Secondary | ICD-10-CM | POA: Diagnosis not present

## 2020-06-12 DIAGNOSIS — Z13220 Encounter for screening for lipoid disorders: Secondary | ICD-10-CM | POA: Diagnosis not present

## 2020-06-12 DIAGNOSIS — Z131 Encounter for screening for diabetes mellitus: Secondary | ICD-10-CM | POA: Diagnosis not present

## 2020-06-12 DIAGNOSIS — Z0000 Encounter for general adult medical examination without abnormal findings: Secondary | ICD-10-CM | POA: Diagnosis not present

## 2020-07-26 ENCOUNTER — Telehealth: Payer: Self-pay

## 2020-07-26 NOTE — Telephone Encounter (Signed)
Copied from Miguel Barrera [id number]. Topic: General - Other >> Jul 26, 2020 12:19 PM Tessa Lerner A wrote: Reason for CRM: Patient has returned missed call from practice  Agent saw no open encounters at the time of returned call  Patient shared that they were contacted at work, but there work number is not included in the chart  Please contact to further advise when possible

## 2020-08-01 DIAGNOSIS — M5430 Sciatica, unspecified side: Secondary | ICD-10-CM | POA: Diagnosis not present

## 2020-08-01 DIAGNOSIS — G894 Chronic pain syndrome: Secondary | ICD-10-CM | POA: Diagnosis not present

## 2020-08-01 DIAGNOSIS — M792 Neuralgia and neuritis, unspecified: Secondary | ICD-10-CM | POA: Diagnosis not present

## 2020-08-09 DIAGNOSIS — Z7189 Other specified counseling: Secondary | ICD-10-CM | POA: Diagnosis not present

## 2020-10-03 DIAGNOSIS — M792 Neuralgia and neuritis, unspecified: Secondary | ICD-10-CM | POA: Diagnosis not present

## 2020-10-03 DIAGNOSIS — M48062 Spinal stenosis, lumbar region with neurogenic claudication: Secondary | ICD-10-CM | POA: Diagnosis not present

## 2020-10-03 DIAGNOSIS — G894 Chronic pain syndrome: Secondary | ICD-10-CM | POA: Diagnosis not present

## 2020-10-03 DIAGNOSIS — R5382 Chronic fatigue, unspecified: Secondary | ICD-10-CM | POA: Diagnosis not present

## 2020-10-03 DIAGNOSIS — M5430 Sciatica, unspecified side: Secondary | ICD-10-CM | POA: Diagnosis not present

## 2020-10-03 DIAGNOSIS — Z79891 Long term (current) use of opiate analgesic: Secondary | ICD-10-CM | POA: Diagnosis not present

## 2020-10-03 DIAGNOSIS — M79669 Pain in unspecified lower leg: Secondary | ICD-10-CM | POA: Diagnosis not present

## 2020-10-03 DIAGNOSIS — M47897 Other spondylosis, lumbosacral region: Secondary | ICD-10-CM | POA: Diagnosis not present

## 2020-10-03 DIAGNOSIS — G8929 Other chronic pain: Secondary | ICD-10-CM | POA: Diagnosis not present

## 2020-11-07 DIAGNOSIS — E782 Mixed hyperlipidemia: Secondary | ICD-10-CM | POA: Diagnosis not present

## 2020-11-07 DIAGNOSIS — Z131 Encounter for screening for diabetes mellitus: Secondary | ICD-10-CM | POA: Diagnosis not present

## 2020-11-14 DIAGNOSIS — R972 Elevated prostate specific antigen [PSA]: Secondary | ICD-10-CM | POA: Diagnosis not present

## 2020-11-14 DIAGNOSIS — E782 Mixed hyperlipidemia: Secondary | ICD-10-CM | POA: Diagnosis not present

## 2020-12-05 DIAGNOSIS — M5430 Sciatica, unspecified side: Secondary | ICD-10-CM | POA: Diagnosis not present

## 2020-12-05 DIAGNOSIS — G894 Chronic pain syndrome: Secondary | ICD-10-CM | POA: Diagnosis not present

## 2020-12-05 DIAGNOSIS — Z79891 Long term (current) use of opiate analgesic: Secondary | ICD-10-CM | POA: Diagnosis not present

## 2020-12-05 DIAGNOSIS — M792 Neuralgia and neuritis, unspecified: Secondary | ICD-10-CM | POA: Diagnosis not present

## 2020-12-26 ENCOUNTER — Encounter: Payer: Self-pay | Admitting: Dermatology

## 2020-12-26 ENCOUNTER — Other Ambulatory Visit: Payer: Self-pay

## 2020-12-26 ENCOUNTER — Ambulatory Visit: Payer: Medicare Other | Admitting: Dermatology

## 2020-12-26 DIAGNOSIS — Z85828 Personal history of other malignant neoplasm of skin: Secondary | ICD-10-CM

## 2020-12-26 DIAGNOSIS — L814 Other melanin hyperpigmentation: Secondary | ICD-10-CM

## 2020-12-26 DIAGNOSIS — D171 Benign lipomatous neoplasm of skin and subcutaneous tissue of trunk: Secondary | ICD-10-CM

## 2020-12-26 DIAGNOSIS — L578 Other skin changes due to chronic exposure to nonionizing radiation: Secondary | ICD-10-CM | POA: Diagnosis not present

## 2020-12-26 DIAGNOSIS — Z1283 Encounter for screening for malignant neoplasm of skin: Secondary | ICD-10-CM | POA: Diagnosis not present

## 2020-12-26 DIAGNOSIS — L820 Inflamed seborrheic keratosis: Secondary | ICD-10-CM | POA: Diagnosis not present

## 2020-12-26 DIAGNOSIS — D1800 Hemangioma unspecified site: Secondary | ICD-10-CM

## 2020-12-26 DIAGNOSIS — D229 Melanocytic nevi, unspecified: Secondary | ICD-10-CM

## 2020-12-26 DIAGNOSIS — L821 Other seborrheic keratosis: Secondary | ICD-10-CM

## 2020-12-26 NOTE — Progress Notes (Signed)
   Follow-Up Visit   Subjective  Kevin Carpenter is a 65 y.o. male who presents for the following: Annual Exam (HxBCC. 10/18/2014 R nasal alar rim. No particular areas of concern today. ). Patient here for full body skin exam and skin cancer screening.  The following portions of the chart were reviewed this encounter and updated as appropriate:  Tobacco  Allergies  Meds  Problems  Med Hx  Surg Hx  Fam Hx     Review of Systems: No other skin or systemic complaints except as noted in HPI or Assessment and Plan.  Objective  Well appearing patient in no apparent distress; mood and affect are within normal limits.  A full examination was performed including scalp, head, eyes, ears, nose, lips, neck, chest, axillae, abdomen, back, buttocks, bilateral upper extremities, bilateral lower extremities, hands, feet, fingers, toes, fingernails, and toenails. All findings within normal limits unless otherwise noted below.  Mid Parietal Scalp x1, left forehead x2 (3) Erythematous keratotic or waxy stuck-on papule or plaque.   Left Upper Back 9x8 cm subcutaneous rubbery nodule  Assessment & Plan  Inflamed seborrheic keratosis Mid Parietal Scalp x1, left forehead x2  Destruction of lesion - Mid Parietal Scalp x1, left forehead x2 Complexity: simple   Destruction method: cryotherapy   Informed consent: discussed and consent obtained   Timeout:  patient name, date of birth, surgical site, and procedure verified Lesion destroyed using liquid nitrogen: Yes   Region frozen until ice ball extended beyond lesion: Yes   Outcome: patient tolerated procedure well with no complications   Post-procedure details: wound care instructions given    Lipoma of torso Left Upper Back  Benign, observe.    Lentigines - Scattered tan macules - Due to sun exposure - Benign-appearing, observe - Recommend daily broad spectrum sunscreen SPF 30+ to sun-exposed areas, reapply every 2 hours as needed. - Call for  any changes  Seborrheic Keratoses - Stuck-on, waxy, tan-brown papules and/or plaques  - Benign-appearing - Discussed benign etiology and prognosis. - Observe - Call for any changes  Melanocytic Nevi - Tan-brown and/or pink-flesh-colored symmetric macules and papules - Benign appearing on exam today - Observation - Call clinic for new or changing moles - Recommend daily use of broad spectrum spf 30+ sunscreen to sun-exposed areas.   Hemangiomas - Red papules - Discussed benign nature - Observe - Call for any changes  Actinic Damage - Chronic condition, secondary to cumulative UV/sun exposure - diffuse scaly erythematous macules with underlying dyspigmentation - Recommend daily broad spectrum sunscreen SPF 30+ to sun-exposed areas, reapply every 2 hours as needed.  - Staying in the shade or wearing long sleeves, sun glasses (UVA+UVB protection) and wide brim hats (4-inch brim around the entire circumference of the hat) are also recommended for sun protection.  - Call for new or changing lesions.  Skin cancer screening performed today.  History of Basal Cell Carcinoma of the Skin - No evidence of recurrence today at right nasal alar rim - Recommend regular full body skin exams - Recommend daily broad spectrum sunscreen SPF 30+ to sun-exposed areas, reapply every 2 hours as needed.  - Call if any new or changing lesions are noted between office visits   Return in about 1 year (around 12/26/2021) for TBSE.  I, Emelia Salisbury, CMA, am acting as scribe for Sarina Ser, MD. Documentation: I have reviewed the above documentation for accuracy and completeness, and I agree with the above.  Sarina Ser, MD

## 2020-12-26 NOTE — Patient Instructions (Addendum)
Melanoma ABCDEs  Melanoma is the most dangerous type of skin cancer, and is the leading cause of death from skin disease.  You are more likely to develop melanoma if you: Have light-colored skin, light-colored eyes, or red or blond hair Spend a lot of time in the sun Tan regularly, either outdoors or in a tanning bed Have had blistering sunburns, especially during childhood Have a close family member who has had a melanoma Have atypical moles or large birthmarks  Early detection of melanoma is key since treatment is typically straightforward and cure rates are extremely high if we catch it early.   The first sign of melanoma is often a change in a mole or a new dark spot.  The ABCDE system is a way of remembering the signs of melanoma.  A for asymmetry:  The two halves do not match. B for border:  The edges of the growth are irregular. C for color:  A mixture of colors are present instead of an even brown color. D for diameter:  Melanomas are usually (but not always) greater than 58mm - the size of a pencil eraser. E for evolution:  The spot keeps changing in size, shape, and color.  Please check your skin once per month between visits. You can use a small mirror in front and a large mirror behind you to keep an eye on the back side or your body.   If you see any new or changing lesions before your next follow-up, please call to schedule a visit.  Please continue daily skin protection including broad spectrum sunscreen SPF 30+ to sun-exposed areas, reapplying every 2 hours as needed when you're outdoors.   Staying in the shade or wearing long sleeves, sun glasses (UVA+UVB protection) and wide brim hats (4-inch brim around the entire circumference of the hat) are also recommended for sun protection.    If you have any questions or concerns for your doctor, please call our main line at [phone] and press option 4 to reach your doctor's medical assistant. If no one answers, please leave  a voicemail as directed and we will return your call as soon as possible. Messages left after 4 pm will be answered the following business day.   You may also send [place] a message via [place]. We typically respond to MyChart messages within 1-2 business days.  For prescription refills, please ask your pharmacy to contact our office. Our fax number is [phone].  If you have an urgent issue when the clinic is closed that cannot wait until the next business day, you can page your doctor at the number below.    Please note that while we do our best to be available for urgent issues outside of office hours, we are not available 24/7.   If you have an urgent issue and are unable to reach [place], you may choose to seek medical care at your doctor's office, retail clinic, urgent care center, or emergency room.  If you have a medical emergency, please immediately call 911 or go to the emergency department.  Pager Numbers  - Dr. Nehemiah Massed: [phone]  - Dr. Laurence Ferrari: [phone]  - Dr. Nicole Kindred: [phone]  In the event of inclement weather, please call our main line at [phone] for an update on the status of any delays or closures.  Dermatology Medication Tips: Please keep the boxes that topical medications come in in order to help keep track of the instructions about where and how to use these. Pharmacies typically  print the medication instructions only on the boxes and not directly on the medication tubes.   If your medication is too expensive, please contact our office at [phone] option 4 or send [place] a message through [place].   We are unable to tell what your co-pay for medications will be in advance as this is different depending on your insurance coverage. However, we may be able to find a substitute medication at lower cost or fill out paperwork to get insurance to cover a needed medication.   If a prior authorization is required to get your medication covered by your insurance  company, please allow [place] 1-2 business days to complete this process.  Drug prices often vary depending on where the prescription is filled and some pharmacies may offer cheaper prices.  The website www.goodrx.com contains coupons for medications through different pharmacies. The prices here do not account for what the cost may be with help from insurance (it may be cheaper with your insurance), but the website can give you the price if you did not use any insurance.  - You can print the associated coupon and take it with your prescription to the pharmacy.  - You may also stop by our office during regular business hours and pick up a GoodRx coupon card.  - If you need your prescription sent electronically to a different pharmacy, notify our office through [facility] or by phone at [phone] option 4.  Cryotherapy Aftercare  Wash gently with soap and water everyday.   Apply Vaseline and Band-Aid daily until healed.

## 2020-12-28 ENCOUNTER — Encounter: Payer: Self-pay | Admitting: Dermatology

## 2021-01-16 DIAGNOSIS — G894 Chronic pain syndrome: Secondary | ICD-10-CM | POA: Diagnosis not present

## 2021-01-16 DIAGNOSIS — M722 Plantar fascial fibromatosis: Secondary | ICD-10-CM | POA: Diagnosis not present

## 2021-01-16 DIAGNOSIS — E782 Mixed hyperlipidemia: Secondary | ICD-10-CM | POA: Diagnosis not present

## 2021-01-16 DIAGNOSIS — Z0000 Encounter for general adult medical examination without abnormal findings: Secondary | ICD-10-CM | POA: Diagnosis not present

## 2021-01-16 DIAGNOSIS — Z125 Encounter for screening for malignant neoplasm of prostate: Secondary | ICD-10-CM | POA: Diagnosis not present

## 2021-01-16 DIAGNOSIS — E538 Deficiency of other specified B group vitamins: Secondary | ICD-10-CM | POA: Diagnosis not present

## 2021-04-08 DIAGNOSIS — Z125 Encounter for screening for malignant neoplasm of prostate: Secondary | ICD-10-CM | POA: Diagnosis not present

## 2021-04-08 DIAGNOSIS — R972 Elevated prostate specific antigen [PSA]: Secondary | ICD-10-CM | POA: Diagnosis not present

## 2021-05-16 DIAGNOSIS — Z125 Encounter for screening for malignant neoplasm of prostate: Secondary | ICD-10-CM | POA: Diagnosis not present

## 2021-05-16 DIAGNOSIS — E538 Deficiency of other specified B group vitamins: Secondary | ICD-10-CM | POA: Diagnosis not present

## 2021-05-16 DIAGNOSIS — E782 Mixed hyperlipidemia: Secondary | ICD-10-CM | POA: Diagnosis not present

## 2021-05-24 DIAGNOSIS — I7121 Aneurysm of the ascending aorta, without rupture: Secondary | ICD-10-CM | POA: Diagnosis not present

## 2021-05-24 DIAGNOSIS — Z0000 Encounter for general adult medical examination without abnormal findings: Secondary | ICD-10-CM | POA: Diagnosis not present

## 2021-05-24 DIAGNOSIS — D6859 Other primary thrombophilia: Secondary | ICD-10-CM | POA: Diagnosis not present

## 2021-05-24 DIAGNOSIS — G894 Chronic pain syndrome: Secondary | ICD-10-CM | POA: Diagnosis not present

## 2021-06-14 DIAGNOSIS — J340 Abscess, furuncle and carbuncle of nose: Secondary | ICD-10-CM | POA: Diagnosis not present

## 2021-06-14 DIAGNOSIS — R040 Epistaxis: Secondary | ICD-10-CM | POA: Diagnosis not present

## 2021-10-29 ENCOUNTER — Ambulatory Visit: Payer: PPO | Admitting: Dermatology

## 2021-10-29 ENCOUNTER — Telehealth: Payer: Self-pay

## 2021-10-29 ENCOUNTER — Encounter: Payer: Self-pay | Admitting: Dermatology

## 2021-10-29 DIAGNOSIS — D485 Neoplasm of uncertain behavior of skin: Secondary | ICD-10-CM

## 2021-10-29 DIAGNOSIS — D171 Benign lipomatous neoplasm of skin and subcutaneous tissue of trunk: Secondary | ICD-10-CM | POA: Diagnosis not present

## 2021-10-29 MED ORDER — DOXYCYCLINE MONOHYDRATE 100 MG PO TABS: 100.0000 mg | ORAL_TABLET | Freq: Two times a day (BID) | ORAL | 0 refills | Status: AC

## 2021-10-29 MED ORDER — MUPIROCIN 2 % EX OINT: 1.0000 | TOPICAL_OINTMENT | Freq: Every day | CUTANEOUS | 1 refills | Status: DC

## 2021-10-29 NOTE — Telephone Encounter (Signed)
Lft vm on patient's wifes vm advising to have patient to call if any problems after today's surgery.Mariana Kaufman

## 2021-10-29 NOTE — Progress Notes (Signed)
Follow-Up Visit   Subjective  Kevin Carpenter is a 66 y.o. male who presents for the following: Procedure (Lipoma vs other of left upper back hx of pain- Excise today).  Accompanied by wife  The following portions of the chart were reviewed this encounter and updated as appropriate:   Tobacco  Allergies  Meds  Problems  Med Hx  Surg Hx  Fam Hx     Review of Systems:  No other skin or systemic complaints except as noted in HPI or Assessment and Plan.  Objective  Well appearing patient in no apparent distress; mood and affect are within normal limits.  A focused examination was performed including back. Relevant physical exam findings are noted in the Assessment and Plan.  Left Upper Back Rubbery nodule 10.0 x 8.0cm   Assessment & Plan  Neoplasm of uncertain behavior of skin Left Upper Back  Skin excision  Lesion length (cm):  10 Lesion width (cm):  8 Margin per side (cm):  0 Total excision diameter (cm):  10 Informed consent: discussed and consent obtained   Timeout: patient name, date of birth, surgical site, and procedure verified   Procedure prep:  Patient was prepped and draped in usual sterile fashion Prep type:  Isopropyl alcohol and povidone-iodine Anesthesia: the lesion was anesthetized in a standard fashion   Anesthetic:  1% lidocaine w/ epinephrine 1-100,000 buffered w/ 8.4% NaHCO3 Instrument used: #15 blade   Hemostasis achieved with: pressure   Hemostasis achieved with comment:  Electrocautery Outcome: patient tolerated procedure well with no complications   Post-procedure details: sterile dressing applied and wound care instructions given   Dressing type: bandage and pressure dressing (mupirocin)    Skin repair Complexity:  Complex Final length (cm):  4 Reason for type of repair: reduce tension to allow closure, reduce the risk of dehiscence, infection, and necrosis, reduce subcutaneous dead space and avoid a hematoma, allow closure of the large  defect, preserve normal anatomy, preserve normal anatomical and functional relationships and enhance both functionality and cosmetic results   Undermining: area extensively undermined   Undermining comment:  Undermining defect 10.0 cm Subcutaneous layers (deep stitches):  Suture size:  2-0 Suture type: Vicryl (polyglactin 910)   Subcutaneous suture technique: inverted dermal. Fine/surface layer approximation (top stitches):  Suture size:  2-0 Suture type: nylon   Stitches: simple running   Suture removal (days):  7 Hemostasis achieved with: suture and pressure Outcome: patient tolerated procedure well with no complications   Post-procedure details: sterile dressing applied and wound care instructions given   Dressing type: bandage and pressure dressing (mupirocin)    mupirocin ointment (BACTROBAN) 2 % Apply 1 Application topically daily. Qd to excision site  doxycycline (ADOXA) 100 MG tablet Take 1 tablet (100 mg total) by mouth 2 (two) times daily for 7 days. Take with food and drink  Specimen 1 - Surgical pathology Differential Diagnosis: D48.5 Lipoma vs other  Check Margins: No Rubbery nodule 10.0 x 8.0cm  Start Mupirocin oint qd to excision site Start Doxycycline 100 mg 1 po bid with food and drink for 7 days  Doxycycline should be taken with food to prevent nausea. Do not lay down for 30 minutes after taking. Be cautious with sun exposure and use good sun protection while on this medication. Pregnant women should not take this medication.     Return in about 1 week (around 11/05/2021) for suture removal, TBSE.  I, Ashok Cordia, CMA, am acting as scribe for Sarina Ser, MD .  Documentation: I have reviewed the above documentation for accuracy and completeness, and I agree with the above.  Sarina Ser, MD

## 2021-10-29 NOTE — Patient Instructions (Signed)

## 2021-11-05 ENCOUNTER — Encounter: Payer: Self-pay | Admitting: Dermatology

## 2021-11-05 ENCOUNTER — Ambulatory Visit (INDEPENDENT_AMBULATORY_CARE_PROVIDER_SITE_OTHER): Payer: PPO | Admitting: Dermatology

## 2021-11-05 DIAGNOSIS — Z85828 Personal history of other malignant neoplasm of skin: Secondary | ICD-10-CM

## 2021-11-05 DIAGNOSIS — D229 Melanocytic nevi, unspecified: Secondary | ICD-10-CM

## 2021-11-05 DIAGNOSIS — C44311 Basal cell carcinoma of skin of nose: Secondary | ICD-10-CM

## 2021-11-05 DIAGNOSIS — L578 Other skin changes due to chronic exposure to nonionizing radiation: Secondary | ICD-10-CM

## 2021-11-05 DIAGNOSIS — D171 Benign lipomatous neoplasm of skin and subcutaneous tissue of trunk: Secondary | ICD-10-CM

## 2021-11-05 DIAGNOSIS — L814 Other melanin hyperpigmentation: Secondary | ICD-10-CM

## 2021-11-05 DIAGNOSIS — Z1283 Encounter for screening for malignant neoplasm of skin: Secondary | ICD-10-CM

## 2021-11-05 DIAGNOSIS — Z4802 Encounter for removal of sutures: Secondary | ICD-10-CM

## 2021-11-05 DIAGNOSIS — L821 Other seborrheic keratosis: Secondary | ICD-10-CM | POA: Diagnosis not present

## 2021-11-05 DIAGNOSIS — L570 Actinic keratosis: Secondary | ICD-10-CM | POA: Diagnosis not present

## 2021-11-05 DIAGNOSIS — D1800 Hemangioma unspecified site: Secondary | ICD-10-CM

## 2021-11-05 NOTE — Progress Notes (Signed)
Follow-Up Visit   Subjective  Kevin Carpenter is a 66 y.o. male who presents for the following: Suture / Staple Removal (1 week f/u left upper back excision of a Lipoma 10/29/2021). The patient presents for Total-Body Skin Exam (TBSE) for skin cancer screening and mole check.  The patient has spots, moles and lesions to be evaluated, some may be new or changing and the patient has concerns that these could be cancer.  Hx of BCC  The following portions of the chart were reviewed this encounter and updated as appropriate:   Tobacco  Allergies  Meds  Problems  Med Hx  Surg Hx  Fam Hx     Review of Systems:  No other skin or systemic complaints except as noted in HPI or Assessment and Plan.  Objective  Well appearing patient in no apparent distress; mood and affect are within normal limits.  A full examination was performed including scalp, head, eyes, ears, nose, lips, neck, chest, axillae, abdomen, back, buttocks, bilateral upper extremities, bilateral lower extremities, hands, feet, fingers, toes, fingernails, and toenails. All findings within normal limits unless otherwise noted below.  left nose x 1, left ear x 1, right ear x 1, scalp x 2  (5) (5) Erythematous thin papules/macules with gritty scale.   Left Upper Back Well healed scar    Assessment & Plan  Lipoma of left upper back Left Upper Back  Biopsy proven Lipoma discussed   Encounter for Removal of Sutures - Incision site at the left upper back is clean, dry and intact - Wound cleansed, sutures removed, wound cleansed and steri strips applied.  - Discussed pathology results showing Lipoma   - Patient advised to keep steri-strips dry until they fall off. - Scars remodel for a full year. - Once steri-strips fall off, patient can apply over-the-counter silicone scar cream each night to help with scar remodeling if desired. - Patient advised to call with any concerns or if they notice any new or changing lesions.   AK  (actinic keratosis) (5) left nose x 1, left ear x 1, right ear x 1, scalp x 2  (5)  Actinic keratoses are precancerous spots that appear secondary to cumulative UV radiation exposure/sun exposure over time. They are chronic with expected duration over 1 year. A portion of actinic keratoses will progress to squamous cell carcinoma of the skin. It is not possible to reliably predict which spots will progress to skin cancer and so treatment is recommended to prevent development of skin cancer.  Recommend daily broad spectrum sunscreen SPF 30+ to sun-exposed areas, reapply every 2 hours as needed.  Recommend staying in the shade or wearing long sleeves, sun glasses (UVA+UVB protection) and wide brim hats (4-inch brim around the entire circumference of the hat). Call for new or changing lesions.   Destruction of lesion - left nose x 1, left ear x 1, right ear x 1, scalp x 2  (5) Complexity: simple   Destruction method: cryotherapy   Informed consent: discussed and consent obtained   Timeout:  patient name, date of birth, surgical site, and procedure verified Lesion destroyed using liquid nitrogen: Yes   Region frozen until ice ball extended beyond lesion: Yes   Outcome: patient tolerated procedure well with no complications   Post-procedure details: wound care instructions given    Lentigines - Scattered tan macules - Due to sun exposure - Benign-appearing, observe - Recommend daily broad spectrum sunscreen SPF 30+ to sun-exposed areas, reapply every 2  hours as needed. - Call for any changes  Seborrheic Keratoses - Stuck-on, waxy, tan-brown papules and/or plaques  - Benign-appearing - Discussed benign etiology and prognosis. - Observe - Call for any changes  Melanocytic Nevi - Tan-brown and/or pink-flesh-colored symmetric macules and papules - Benign appearing on exam today - Observation - Call clinic for new or changing moles - Recommend daily use of broad spectrum spf 30+ sunscreen  to sun-exposed areas.   Hemangiomas - Red papules - Discussed benign nature - Observe - Call for any changes  Actinic Damage - Chronic condition, secondary to cumulative UV/sun exposure - diffuse scaly erythematous macules with underlying dyspigmentation - Recommend daily broad spectrum sunscreen SPF 30+ to sun-exposed areas, reapply every 2 hours as needed.  - Staying in the shade or wearing long sleeves, sun glasses (UVA+UVB protection) and wide brim hats (4-inch brim around the entire circumference of the hat) are also recommended for sun protection.  - Call for new or changing lesions.  History of Basal Cell Carcinoma of the Skin Right nasal alar rim 2016 Right nose- Dr Sharlett Iles unknown date - No evidence of recurrence today - Recommend regular full body skin exams - Recommend daily broad spectrum sunscreen SPF 30+ to sun-exposed areas, reapply every 2 hours as needed.  - Call if any new or changing lesions are noted between office visits   Skin cancer screening performed today.   Return in about 1 year (around 11/06/2022) for TBSE, hx of BCC.  IMarye Round, CMA, am acting as scribe for Sarina Ser, MD .  Documentation: I have reviewed the above documentation for accuracy and completeness, and I agree with the above.  Sarina Ser, MD

## 2021-11-05 NOTE — Patient Instructions (Addendum)
Cryotherapy Aftercare  Wash gently with soap and water everyday.   Apply Vaseline and Band-Aid daily until healed.     Due to recent changes in healthcare laws, you may see results of your pathology and/or laboratory studies on MyChart before the doctors have had a chance to review them. We understand that in some cases there may be results that are confusing or concerning to you. Please understand that not all results are received at the same time and often the doctors may need to interpret multiple results in order to provide you with the best plan of care or course of treatment. Therefore, we ask that you please give us 2 business days to thoroughly review all your results before contacting the office for clarification. Should we see a critical lab result, you will be contacted sooner.   If You Need Anything After Your Visit  If you have any questions or concerns for your doctor, please call our main line at [phone] and press option 4 to reach your doctor's medical assistant. If no one answers, please leave a voicemail as directed and we will return your call as soon as possible. Messages left after 4 pm will be answered the following business day.   You may also send us a message via MyChart. We typically respond to MyChart messages within 1-2 business days.  For prescription refills, please ask your pharmacy to contact our office. Our fax number is [phone].  If you have an urgent issue when the clinic is closed that cannot wait until the next business day, you can page your doctor at the number below.    Please note that while we do our best to be available for urgent issues outside of office hours, we are not available 24/7.   If you have an urgent issue and are unable to reach us, you may choose to seek medical care at your doctor's office, retail clinic, urgent care center, or emergency room.  If you have a medical emergency, please immediately call 911 or go to the  emergency department.  Pager Numbers  - Dr. Kowalski: [phone]  - Dr. Moye: [phone]  - Dr. Stewart: [phone]  In the event of inclement weather, please call our main line at [phone] for an update on the status of any delays or closures.  Dermatology Medication Tips: Please keep the boxes that topical medications come in in order to help keep track of the instructions about where and how to use these. Pharmacies typically print the medication instructions only on the boxes and not directly on the medication tubes.   If your medication is too expensive, please contact our office at [phone] option 4 or send us a message through MyChart.   We are unable to tell what your co-pay for medications will be in advance as this is different depending on your insurance coverage. However, we may be able to find a substitute medication at lower cost or fill out paperwork to get insurance to cover a needed medication.   If a prior authorization is required to get your medication covered by your insurance company, please allow us 1-2 business days to complete this process.  Drug prices often vary depending on where the prescription is filled and some pharmacies may offer cheaper prices.  The website www.goodrx.com contains coupons for medications through different pharmacies. The prices here do not account for what the cost may be with help from insurance (it may be cheaper with your insurance), but the website can   give you the price if you did not use any insurance.  - You can print the associated coupon and take it with your prescription to the pharmacy.  - You may also stop by our office during regular business hours and pick up a GoodRx coupon card.  - If you need your prescription sent electronically to a different pharmacy, notify our office through [facility] MyChart or by phone at [phone] option 4.     Si Usted Necesita Algo Despus de Su Visita  Tambin puede  enviarnos un mensaje a travs de MyChart. Por lo general respondemos a los mensajes de MyChart en el transcurso de 1 a 2 das hbiles.  Para renovar recetas, por favor pida a su farmacia que se ponga en contacto con nuestra oficina. Nuestro nmero de fax es el [phone].  Si tiene un asunto urgente cuando la clnica est cerrada y que no puede esperar hasta el siguiente da hbil, puede llamar/localizar a su doctor(a) al nmero que aparece a continuacin.   Por favor, tenga en cuenta que aunque hacemos todo lo posible para estar disponibles para asuntos urgentes fuera del horario de oficina, no estamos disponibles las 24 horas del da, los 7 das de la semana.   Si tiene un problema urgente y no puede comunicarse con nosotros, puede optar por buscar atencin mdica  en el consultorio de su doctor(a), en una clnica privada, en un centro de atencin urgente o en una sala de emergencias.  Si tiene una emergencia mdica, por favor llame inmediatamente al 911 o vaya a la sala de emergencias.  Nmeros de bper  - Dr. Kowalski: [phone]  - Dra. Moye: [phone]  - Dra. Stewart: [phone]  En caso de inclemencias del tiempo, por favor llame a nuestra lnea principal al [phone] para una actualizacin sobre el estado de cualquier retraso o cierre.  Consejos para la medicacin en dermatologa: Por favor, guarde las cajas en las que vienen los medicamentos de uso tpico para ayudarle a seguir las instrucciones sobre dnde y cmo usarlos. Las farmacias generalmente imprimen las instrucciones del medicamento slo en las cajas y no directamente en los tubos del medicamento.   Si su medicamento es muy caro, por favor, pngase en contacto con nuestra oficina llamando al [phone] y presione la opcin 4 o envenos un mensaje a travs de MyChart.   No podemos decirle cul ser su copago por los medicamentos por adelantado ya que esto es diferente dependiendo de la cobertura de su seguro.  Sin embargo, es posible que podamos encontrar un medicamento sustituto a menor costo o llenar un formulario para que el seguro cubra el medicamento que se considera necesario.   Si se requiere una autorizacin previa para que su compaa de seguros cubra su medicamento, por favor permtanos de 1 a 2 das hbiles para completar este proceso.  Los precios de los medicamentos varan con frecuencia dependiendo del lugar de dnde se surte la receta y alguna farmacias pueden ofrecer precios ms baratos.  El sitio web www.goodrx.com tiene cupones para medicamentos de diferentes farmacias. Los precios aqu no tienen en cuenta lo que podra costar con la ayuda del seguro (puede ser ms barato con su seguro), pero el sitio web puede darle el precio si no utiliz ningn seguro.  - Puede imprimir el cupn correspondiente y llevarlo con su receta a la farmacia.  - Tambin puede pasar por nuestra oficina durante el horario de atencin regular y recoger una tarjeta de cupones de GoodRx.  -   Si necesita que su receta se enve electrnicamente a una farmacia diferente, informe a nuestra oficina a travs de MyChart de [facility] o por telfono llamando al [phone] y presione la opcin 4.  

## 2021-11-28 DIAGNOSIS — E782 Mixed hyperlipidemia: Secondary | ICD-10-CM | POA: Diagnosis not present

## 2021-11-28 DIAGNOSIS — I7121 Aneurysm of the ascending aorta, without rupture: Secondary | ICD-10-CM | POA: Diagnosis not present

## 2021-11-28 DIAGNOSIS — F1190 Opioid use, unspecified, uncomplicated: Secondary | ICD-10-CM | POA: Diagnosis not present

## 2021-11-28 DIAGNOSIS — Z125 Encounter for screening for malignant neoplasm of prostate: Secondary | ICD-10-CM | POA: Diagnosis not present

## 2021-12-12 DIAGNOSIS — I7121 Aneurysm of the ascending aorta, without rupture: Secondary | ICD-10-CM | POA: Diagnosis not present

## 2021-12-12 DIAGNOSIS — D6859 Other primary thrombophilia: Secondary | ICD-10-CM | POA: Diagnosis not present

## 2021-12-12 DIAGNOSIS — E782 Mixed hyperlipidemia: Secondary | ICD-10-CM | POA: Diagnosis not present

## 2022-01-01 ENCOUNTER — Ambulatory Visit: Payer: BC Managed Care – PPO | Admitting: Dermatology

## 2022-05-21 DIAGNOSIS — E782 Mixed hyperlipidemia: Secondary | ICD-10-CM | POA: Diagnosis not present

## 2022-05-21 DIAGNOSIS — Z125 Encounter for screening for malignant neoplasm of prostate: Secondary | ICD-10-CM | POA: Diagnosis not present

## 2022-05-21 DIAGNOSIS — F1190 Opioid use, unspecified, uncomplicated: Secondary | ICD-10-CM | POA: Diagnosis not present

## 2022-05-28 DIAGNOSIS — R972 Elevated prostate specific antigen [PSA]: Secondary | ICD-10-CM | POA: Diagnosis not present

## 2022-05-28 DIAGNOSIS — D6859 Other primary thrombophilia: Secondary | ICD-10-CM | POA: Diagnosis not present

## 2022-05-28 DIAGNOSIS — Z79899 Other long term (current) drug therapy: Secondary | ICD-10-CM | POA: Diagnosis not present

## 2022-05-28 DIAGNOSIS — I7121 Aneurysm of the ascending aorta, without rupture: Secondary | ICD-10-CM | POA: Diagnosis not present

## 2022-05-28 DIAGNOSIS — Z0000 Encounter for general adult medical examination without abnormal findings: Secondary | ICD-10-CM | POA: Diagnosis not present

## 2022-05-29 ENCOUNTER — Other Ambulatory Visit: Payer: Self-pay | Admitting: Internal Medicine

## 2022-05-29 DIAGNOSIS — Z0000 Encounter for general adult medical examination without abnormal findings: Secondary | ICD-10-CM

## 2022-05-29 DIAGNOSIS — I7121 Aneurysm of the ascending aorta, without rupture: Secondary | ICD-10-CM

## 2022-06-16 DIAGNOSIS — Z125 Encounter for screening for malignant neoplasm of prostate: Secondary | ICD-10-CM | POA: Diagnosis not present

## 2022-06-16 DIAGNOSIS — R972 Elevated prostate specific antigen [PSA]: Secondary | ICD-10-CM | POA: Diagnosis not present

## 2022-06-17 ENCOUNTER — Ambulatory Visit
Admission: RE | Admit: 2022-06-17 | Discharge: 2022-06-17 | Disposition: A | Discharge status: Home / Self Care | Payer: PPO | Source: Ambulatory Visit | Attending: Internal Medicine | Admitting: Internal Medicine

## 2022-06-17 DIAGNOSIS — Z0000 Encounter for general adult medical examination without abnormal findings: Secondary | ICD-10-CM | POA: Insufficient documentation

## 2022-06-17 DIAGNOSIS — I7121 Aneurysm of the ascending aorta, without rupture: Secondary | ICD-10-CM | POA: Diagnosis not present

## 2022-06-17 MED ORDER — IOHEXOL 350 MG/ML SOLN
75.0000 mL | Freq: Once | INTRAVENOUS | Status: AC | PRN
  Administered 2022-06-17: 75 mL via INTRAVENOUS

## 2022-07-02 DIAGNOSIS — K5730 Diverticulosis of large intestine without perforation or abscess without bleeding: Secondary | ICD-10-CM | POA: Diagnosis not present

## 2022-07-02 DIAGNOSIS — R972 Elevated prostate specific antigen [PSA]: Secondary | ICD-10-CM | POA: Diagnosis not present

## 2022-07-02 DIAGNOSIS — K4020 Bilateral inguinal hernia, without obstruction or gangrene, not specified as recurrent: Secondary | ICD-10-CM | POA: Diagnosis not present

## 2022-07-02 DIAGNOSIS — M47816 Spondylosis without myelopathy or radiculopathy, lumbar region: Secondary | ICD-10-CM | POA: Diagnosis not present

## 2022-11-18 DIAGNOSIS — R519 Headache, unspecified: Secondary | ICD-10-CM | POA: Diagnosis not present

## 2022-11-18 DIAGNOSIS — J301 Allergic rhinitis due to pollen: Secondary | ICD-10-CM | POA: Diagnosis not present

## 2022-11-20 DIAGNOSIS — R972 Elevated prostate specific antigen [PSA]: Secondary | ICD-10-CM | POA: Diagnosis not present

## 2022-11-20 DIAGNOSIS — Z79899 Other long term (current) drug therapy: Secondary | ICD-10-CM | POA: Diagnosis not present

## 2022-11-27 DIAGNOSIS — R739 Hyperglycemia, unspecified: Secondary | ICD-10-CM | POA: Diagnosis not present

## 2022-11-27 DIAGNOSIS — Z125 Encounter for screening for malignant neoplasm of prostate: Secondary | ICD-10-CM | POA: Diagnosis not present

## 2022-11-27 DIAGNOSIS — E782 Mixed hyperlipidemia: Secondary | ICD-10-CM | POA: Diagnosis not present

## 2022-11-27 DIAGNOSIS — D6859 Other primary thrombophilia: Secondary | ICD-10-CM | POA: Diagnosis not present

## 2022-11-27 DIAGNOSIS — I7121 Aneurysm of the ascending aorta, without rupture: Secondary | ICD-10-CM | POA: Diagnosis not present

## 2022-12-18 ENCOUNTER — Ambulatory Visit: Payer: PPO | Admitting: Dermatology

## 2022-12-18 ENCOUNTER — Encounter: Payer: Self-pay | Admitting: Dermatology

## 2022-12-18 VITALS — BP 133/86 | HR 72

## 2022-12-18 DIAGNOSIS — I8393 Asymptomatic varicose veins of bilateral lower extremities: Secondary | ICD-10-CM | POA: Diagnosis not present

## 2022-12-18 DIAGNOSIS — L570 Actinic keratosis: Secondary | ICD-10-CM

## 2022-12-18 DIAGNOSIS — L578 Other skin changes due to chronic exposure to nonionizing radiation: Secondary | ICD-10-CM | POA: Diagnosis not present

## 2022-12-18 DIAGNOSIS — Z872 Personal history of diseases of the skin and subcutaneous tissue: Secondary | ICD-10-CM

## 2022-12-18 DIAGNOSIS — Z1283 Encounter for screening for malignant neoplasm of skin: Secondary | ICD-10-CM

## 2022-12-18 DIAGNOSIS — L821 Other seborrheic keratosis: Secondary | ICD-10-CM

## 2022-12-18 DIAGNOSIS — I781 Nevus, non-neoplastic: Secondary | ICD-10-CM | POA: Diagnosis not present

## 2022-12-18 DIAGNOSIS — W908XXA Exposure to other nonionizing radiation, initial encounter: Secondary | ICD-10-CM | POA: Diagnosis not present

## 2022-12-18 DIAGNOSIS — D1801 Hemangioma of skin and subcutaneous tissue: Secondary | ICD-10-CM

## 2022-12-18 DIAGNOSIS — L814 Other melanin hyperpigmentation: Secondary | ICD-10-CM

## 2022-12-18 DIAGNOSIS — D229 Melanocytic nevi, unspecified: Secondary | ICD-10-CM

## 2022-12-18 NOTE — Progress Notes (Signed)
Follow-Up Visit   Subjective  Kevin Carpenter is a 67 y.o. male who presents for the following: Skin Cancer Screening and Full Body Skin Exam hx of aks, hx of lipoma   The patient presents for Total-Body Skin Exam (TBSE) for skin cancer screening and mole check. The patient has spots, moles and lesions to be evaluated, some may be new or changing and the patient may have concern these could be cancer.    The following portions of the chart were reviewed this encounter and updated as appropriate: medications, allergies, medical history  Review of Systems:  No other skin or systemic complaints except as noted in HPI or Assessment and Plan.  Objective  Well appearing patient in no apparent distress; mood and affect are within normal limits.  A full examination was performed including scalp, head, eyes, ears, nose, lips, neck, chest, axillae, abdomen, back, buttocks, bilateral upper extremities, bilateral lower extremities, hands, feet, fingers, toes, fingernails, and toenails. All findings within normal limits unless otherwise noted below.   Relevant physical exam findings are noted in the Assessment and Plan.  Scalp x 5 (5) Erythematous thin papules/macules with gritty scale.     Assessment & Plan   SKIN CANCER SCREENING PERFORMED TODAY.  ACTINIC DAMAGE - Chronic condition, secondary to cumulative UV/sun exposure - diffuse scaly erythematous macules with underlying dyspigmentation - Recommend daily broad spectrum sunscreen SPF 30+ to sun-exposed areas, reapply every 2 hours as needed.  - Staying in the shade or wearing long sleeves, sun glasses (UVA+UVB protection) and wide brim hats (4-inch brim around the entire circumference of the hat) are also recommended for sun protection.  - Call for new or changing lesions.  Varicose Veins/Spider Veins - Dilated blue, purple or red veins at the lower extremities - Reassured - Smaller vessels can be treated by sclerotherapy (a procedure  to inject a medicine into the veins to make them disappear) if desired, but the treatment is not covered by insurance. Larger vessels may be covered if symptomatic and we would refer to vascular surgeon if treatment desired.   LENTIGINES, SEBORRHEIC KERATOSES, HEMANGIOMAS - Benign normal skin lesions - Benign-appearing - Call for any changes  MELANOCYTIC NEVI - Tan-brown and/or pink-flesh-colored symmetric macules and papules - Benign appearing on exam today - Observation - Call clinic for new or changing moles - Recommend daily use of broad spectrum spf 30+ sunscreen to sun-exposed areas.    Actinic keratosis (5) Scalp x 5  Actinic keratoses are precancerous spots that appear secondary to cumulative UV radiation exposure/sun exposure over time. They are chronic with expected duration over 1 year. A portion of actinic keratoses will progress to squamous cell carcinoma of the skin. It is not possible to reliably predict which spots will progress to skin cancer and so treatment is recommended to prevent development of skin cancer.  Recommend daily broad spectrum sunscreen SPF 30+ to sun-exposed areas, reapply every 2 hours as needed.  Recommend staying in the shade or wearing long sleeves, sun glasses (UVA+UVB protection) and wide brim hats (4-inch brim around the entire circumference of the hat). Call for new or changing lesions.  Destruction of lesion - Scalp x 5 (5) Complexity: simple   Destruction method: cryotherapy   Informed consent: discussed and consent obtained   Timeout:  patient name, date of birth, surgical site, and procedure verified Lesion destroyed using liquid nitrogen: Yes   Region frozen until ice ball extended beyond lesion: Yes   Outcome: patient tolerated procedure  well with no complications   Post-procedure details: wound care instructions given     Return in about 1 year (around 12/18/2023) for TBSE.  IAsher Muir, CMA, am acting as scribe for Armida Sans, MD.   Documentation: I have reviewed the above documentation for accuracy and completeness, and I agree with the above.  Armida Sans, MD

## 2022-12-18 NOTE — Patient Instructions (Addendum)
Cryotherapy Aftercare  Wash gently with soap and water everyday.   Apply Vaseline and Band-Aid daily until healed.   Melanoma ABCDEs  Melanoma is the most dangerous type of skin cancer, and is the leading cause of death from skin disease.  You are more likely to develop melanoma if you: Have light-colored skin, light-colored eyes, or red or blond hair Spend a lot of time in the sun Tan regularly, either outdoors or in a tanning bed Have had blistering sunburns, especially during childhood Have a close family member who has had a melanoma Have atypical moles or large birthmarks  Early detection of melanoma is key since treatment is typically straightforward and cure rates are extremely high if we catch it early.   The first sign of melanoma is often a change in a mole or a new dark spot.  The ABCDE system is a way of remembering the signs of melanoma.  A for asymmetry:  The two halves do not match. B for border:  The edges of the growth are irregular. C for color:  A mixture of colors are present instead of an even brown color. D for diameter:  Melanomas are usually (but not always) greater than 6mm - the size of a pencil eraser. E for evolution:  The spot keeps changing in size, shape, and color.  Please check your skin once per month between visits. You can use a small mirror in front and a large mirror behind you to keep an eye on the back side or your body.   If you see any new or changing lesions before your next follow-up, please call to schedule a visit.  Please continue daily skin protection including broad spectrum sunscreen SPF 30+ to sun-exposed areas, reapplying every 2 hours as needed when you're outdoors.   Staying in the shade or wearing long sleeves, sun glasses (UVA+UVB protection) and wide brim hats (4-inch brim around the entire circumference of the hat) are also recommended for sun protection.    Due to recent changes in healthcare laws, you may see results of your  pathology and/or laboratory studies on MyChart before the doctors have had a chance to review them. We understand that in some cases there may be results that are confusing or concerning to you. Please understand that not all results are received at the same time and often the doctors may need to interpret multiple results in order to provide you with the [place] plan of care or course of treatment. Therefore, we ask that you please give [place] 2 business days to thoroughly review all your results before contacting the office for clarification. Should we see a critical lab result, you will be contacted sooner.   If You Need Anything After Your Visit  If you have any questions or concerns for your doctor, please call our main line at [phone] and press option 4 to reach your doctor's medical assistant. If no one answers, please leave a voicemail as directed and we will return your call as soon as possible. Messages left after 4 pm will be answered the following business day.   You may also send [place] a message via MyChart. We typically respond to MyChart messages within 1-2 business days.  For prescription refills, please ask your pharmacy to contact our office. Our fax number is [phone].  If you have an urgent issue when the clinic is closed that cannot wait until the next business day, you can page your doctor at the number below.  Please note that while we do our [place] to be available for urgent issues outside of office hours, we are not available 24/7.   If you have an urgent issue and are unable to reach [place], you may choose to seek medical care at your doctor's office, retail clinic, urgent care center, or emergency room.  If you have a medical emergency, please immediately call 911 or go to the emergency department.  Pager Numbers  - Dr. Gwen Pounds: [phone]  - Dr. Roseanne Reno: [phone]  - Dr. Katrinka Blazing: [phone]   In the event of inclement weather, please call our main line at  [phone] for an update on the status of any delays or closures.  Dermatology Medication Tips: Please keep the boxes that topical medications come in in order to help keep track of the instructions about where and how to use these. Pharmacies typically print the medication instructions only on the boxes and not directly on the medication tubes.   If your medication is too expensive, please contact our office at [phone] option 4 or send [place] a message through MyChart.   We are unable to tell what your co-pay for medications will be in advance as this is different depending on your insurance coverage. However, we may be able to find a substitute medication at lower cost or fill out paperwork to get insurance to cover a needed medication.   If a prior authorization is required to get your medication covered by your insurance company, please allow [place] 1-2 business days to complete this process.  Drug prices often vary depending on where the prescription is filled and some pharmacies may offer cheaper prices.  The website www.goodrx.com contains coupons for medications through different pharmacies. The prices here do not account for what the cost may be with help from insurance (it may be cheaper with your insurance), but the website can give you the price if you did not use any insurance.  - You can print the associated coupon and take it with your prescription to the pharmacy.  - You may also stop by our office during regular business hours and pick up a GoodRx coupon card.  - If you need your prescription sent electronically to a different pharmacy, notify our office through [facility] or by phone at [phone] option 4.     Si Usted Necesita Algo Despus de Su Visita  Tambin puede enviarnos un mensaje a travs de Clinical cytogeneticist. Por lo general respondemos a los mensajes de MyChart en el transcurso de 1 a 2 das hbiles.  Para renovar recetas, por favor pida a su farmacia que se  ponga en contacto con nuestra oficina. Annie Sable de fax es [place] [phone].  Si tiene un asunto urgente cuando la clnica est cerrada y que no puede esperar hasta el siguiente da hbil, puede llamar/localizar a su doctor(a) al nmero que aparece a continuacin.   Por favor, tenga en cuenta que aunque hacemos todo lo posible para estar disponibles para asuntos urgentes fuera del horario de [place], no estamos disponibles las 24 horas del da, los 7 [address]  [address] de la [place].   Si tiene un problema urgente y no puede comunicarse con nosotros, puede optar por buscar atencin mdica  en el consultorio de su doctor(a), en una clnica privada, en un centro de atencin urgente o en una sala de emergencias.  Si tiene Engineer, drilling, por favor llame inmediatamente al 911 o vaya a la sala de emergencias.  Nmeros de bper  - Dr.  Gwen Pounds: [phone]  - Dra. Roseanne Reno: [phone]  - Dr. Katrinka Blazing: [phone]   En caso de inclemencias del tiempo, por favor llame a Lacy Duverney principal al [phone] para una actualizacin sobre el [place] de cualquier retraso o cierre.  Consejos para la medicacin en dermatologa: Por favor, guarde las cajas en las que vienen los medicamentos de uso tpico para ayudarle a seguir las instrucciones sobre dnde y cmo usarlos. Las farmacias generalmente imprimen las instrucciones del medicamento slo en las cajas y no directamente en los tubos del [place].   Si su medicamento es muy caro, por favor, pngase en contacto con Rolm Gala llamando al [phone] y presione la opcin 4 o envenos un mensaje a travs de Clinical cytogeneticist.   No podemos decirle cul ser su copago por los medicamentos por adelantado ya que esto es diferente dependiendo de la cobertura de su seguro. Sin embargo, es posible que podamos encontrar un medicamento sustituto a Audiological scientist un formulario para que el seguro cubra el medicamento que se considera necesario.   Si se requiere  una autorizacin previa para que su compaa de seguros [place] su medicamento, por favor permtanos de 1 a 2 das hbiles para completar [address]  Los precios de los medicamentos varan con frecuencia dependiendo del Environmental consultant de dnde se surte la receta y alguna farmacias pueden ofrecer precios ms baratos.  El sitio web www.goodrx.com tiene cupones para medicamentos de Health and safety inspector. Los precios aqu no tienen en cuenta lo que podra costar con la ayuda del seguro (puede ser ms barato con su seguro), pero el sitio web puede darle el precio si no utiliz Tourist information centre manager.  - Puede imprimir el cupn correspondiente y llevarlo con su receta a la farmacia.  - Tambin puede pasar por nuestra oficina durante el horario de atencin regular y Education officer, museum una tarjeta de cupones de GoodRx.  - Si necesita que su receta se enve electrnicamente a una farmacia diferente, informe a nuestra oficina a travs de MyChart de [place] o por telfono llamando al [phone] y presione la opcin 4.

## 2022-12-23 ENCOUNTER — Encounter: Payer: Self-pay | Admitting: Dermatology

## 2022-12-30 DIAGNOSIS — N400 Enlarged prostate without lower urinary tract symptoms: Secondary | ICD-10-CM | POA: Diagnosis not present

## 2022-12-30 DIAGNOSIS — Z125 Encounter for screening for malignant neoplasm of prostate: Secondary | ICD-10-CM | POA: Diagnosis not present

## 2023-02-23 DIAGNOSIS — R040 Epistaxis: Secondary | ICD-10-CM | POA: Diagnosis not present

## 2023-02-23 DIAGNOSIS — J3489 Other specified disorders of nose and nasal sinuses: Secondary | ICD-10-CM | POA: Diagnosis not present

## 2023-06-17 DIAGNOSIS — E782 Mixed hyperlipidemia: Secondary | ICD-10-CM | POA: Diagnosis not present

## 2023-06-17 DIAGNOSIS — Z125 Encounter for screening for malignant neoplasm of prostate: Secondary | ICD-10-CM | POA: Diagnosis not present

## 2023-06-17 DIAGNOSIS — R739 Hyperglycemia, unspecified: Secondary | ICD-10-CM | POA: Diagnosis not present

## 2023-06-22 ENCOUNTER — Other Ambulatory Visit: Payer: Self-pay | Admitting: Internal Medicine

## 2023-06-22 DIAGNOSIS — R972 Elevated prostate specific antigen [PSA]: Secondary | ICD-10-CM | POA: Diagnosis not present

## 2023-06-22 DIAGNOSIS — Z0000 Encounter for general adult medical examination without abnormal findings: Secondary | ICD-10-CM

## 2023-06-22 DIAGNOSIS — I7121 Aneurysm of the ascending aorta, without rupture: Secondary | ICD-10-CM

## 2023-06-22 DIAGNOSIS — R739 Hyperglycemia, unspecified: Secondary | ICD-10-CM | POA: Diagnosis not present

## 2023-06-22 DIAGNOSIS — E782 Mixed hyperlipidemia: Secondary | ICD-10-CM | POA: Diagnosis not present

## 2023-06-22 DIAGNOSIS — Z79899 Other long term (current) drug therapy: Secondary | ICD-10-CM | POA: Diagnosis not present

## 2023-06-22 DIAGNOSIS — D6859 Other primary thrombophilia: Secondary | ICD-10-CM | POA: Diagnosis not present

## 2023-09-30 DIAGNOSIS — F430 Acute stress reaction: Secondary | ICD-10-CM | POA: Diagnosis not present

## 2023-09-30 DIAGNOSIS — R42 Dizziness and giddiness: Secondary | ICD-10-CM | POA: Diagnosis not present

## 2023-10-19 ENCOUNTER — Ambulatory Visit
Admission: RE | Admit: 2023-10-19 | Discharge: 2023-10-19 | Disposition: A | Discharge status: Home / Self Care | Source: Ambulatory Visit | Attending: Internal Medicine | Admitting: Internal Medicine

## 2023-10-19 DIAGNOSIS — I7121 Aneurysm of the ascending aorta, without rupture: Secondary | ICD-10-CM | POA: Insufficient documentation

## 2023-10-19 DIAGNOSIS — Z0000 Encounter for general adult medical examination without abnormal findings: Secondary | ICD-10-CM | POA: Diagnosis not present

## 2023-10-19 DIAGNOSIS — N281 Cyst of kidney, acquired: Secondary | ICD-10-CM | POA: Diagnosis not present

## 2023-10-19 DIAGNOSIS — E041 Nontoxic single thyroid nodule: Secondary | ICD-10-CM | POA: Diagnosis not present

## 2023-10-19 DIAGNOSIS — K449 Diaphragmatic hernia without obstruction or gangrene: Secondary | ICD-10-CM | POA: Diagnosis not present

## 2023-10-19 MED ORDER — IOHEXOL 350 MG/ML SOLN
100.0000 mL | Freq: Once | INTRAVENOUS | Status: AC | PRN
  Administered 2023-10-19: 100 mL via INTRAVENOUS

## 2023-11-03 DIAGNOSIS — C4491 Basal cell carcinoma of skin, unspecified: Secondary | ICD-10-CM

## 2023-11-03 HISTORY — DX: Basal cell carcinoma of skin, unspecified: C44.91

## 2023-11-04 ENCOUNTER — Encounter: Payer: Self-pay | Admitting: Dermatology

## 2023-11-04 ENCOUNTER — Ambulatory Visit: Admitting: Dermatology

## 2023-11-04 DIAGNOSIS — L821 Other seborrheic keratosis: Secondary | ICD-10-CM

## 2023-11-04 DIAGNOSIS — W908XXA Exposure to other nonionizing radiation, initial encounter: Secondary | ICD-10-CM

## 2023-11-04 DIAGNOSIS — T148XXA Other injury of unspecified body region, initial encounter: Secondary | ICD-10-CM

## 2023-11-04 DIAGNOSIS — C4441 Basal cell carcinoma of skin of scalp and neck: Secondary | ICD-10-CM | POA: Diagnosis not present

## 2023-11-04 DIAGNOSIS — S0001XA Abrasion of scalp, initial encounter: Secondary | ICD-10-CM | POA: Diagnosis not present

## 2023-11-04 DIAGNOSIS — S70362A Insect bite (nonvenomous), left thigh, initial encounter: Secondary | ICD-10-CM

## 2023-11-04 DIAGNOSIS — L570 Actinic keratosis: Secondary | ICD-10-CM

## 2023-11-04 DIAGNOSIS — D485 Neoplasm of uncertain behavior of skin: Secondary | ICD-10-CM

## 2023-11-04 DIAGNOSIS — L578 Other skin changes due to chronic exposure to nonionizing radiation: Secondary | ICD-10-CM | POA: Diagnosis not present

## 2023-11-04 DIAGNOSIS — D489 Neoplasm of uncertain behavior, unspecified: Secondary | ICD-10-CM

## 2023-11-04 MED ORDER — MUPIROCIN 2 % EX OINT: TOPICAL_OINTMENT | CUTANEOUS | 1 refills | Status: DC

## 2023-11-04 MED ORDER — DOXYCYCLINE MONOHYDRATE 100 MG PO CAPS
100.0000 mg | ORAL_CAPSULE | Freq: Two times a day (BID) | ORAL | 0 refills | Status: AC
Start: 2023-11-04 — End: 2023-11-11

## 2023-11-04 NOTE — Progress Notes (Signed)
 Follow-Up Visit   Subjective  Kevin Carpenter is a 68 y.o. male who presents for the following: spot at right neck he noticed over 5 weeks ago and a spot at left forehead he noticed  Inside left upper thigh- possible bite  Has been working out in yard, upper thigh started itching  The patient has spots, moles and lesions to be evaluated, some may be new or changing and the patient may have concern these could be cancer.   The following portions of the chart were reviewed this encounter and updated as appropriate: medications, allergies, medical history  Review of Systems:  No other skin or systemic complaints except as noted in HPI or Assessment and Plan.  Objective  Well appearing patient in no apparent distress; mood and affect are within normal limits.   A focused examination was performed of the following areas: Face, scalp,   Relevant exam findings are noted in the Assessment and Plan.  left forehead x 1 Erythematous thin papules/macules with gritty scale.  right lateral neck 0.8 cm red papule    Assessment & Plan   Abrasion/laceration from recent trauma injury at posterior top of scalp Exam: crust at scalp Treatment Plan: Start mupirocin  2 % ointment - apply topically to wound bid until healed.    Bite reaction at left thigh near groin Exam: 1 cm or < red papule on left thigh near groin Treatment Plan: Start doxycycline  100 mg capsule - take 1 po bid with food and drink for 1 week Start mupirocin  2 % ointment - apply topically to affected area twice daily until resolved.   Doxycycline  should be taken with food to prevent nausea. Do not lay down for 30 minutes after taking. Be cautious with sun exposure and use good sun protection while on this medication. Pregnant women should not take this medication.    SEBORRHEIC KERATOSIS - Stuck-on, waxy, tan-brown papules and/or plaques  - Benign-appearing - Discussed benign etiology and prognosis. - Observe - Call for  any changes   ACTINIC DAMAGE - chronic, secondary to cumulative UV radiation exposure/sun exposure over time - diffuse scaly erythematous macules with underlying dyspigmentation - Recommend daily broad spectrum sunscreen SPF 30+ to sun-exposed areas, reapply every 2 hours as needed.  - Recommend staying in the shade or wearing long sleeves, sun glasses (UVA+UVB protection) and wide brim hats (4-inch brim around the entire circumference of the hat). - Call for new or changing lesions.  ACTINIC KERATOSIS left forehead x 1 Actinic keratoses are precancerous spots that appear secondary to cumulative UV radiation exposure/sun exposure over time. They are chronic with expected duration over 1 year. A portion of actinic keratoses will progress to squamous cell carcinoma of the skin. It is not possible to reliably predict which spots will progress to skin cancer and so treatment is recommended to prevent development of skin cancer.  Recommend daily broad spectrum sunscreen SPF 30+ to sun-exposed areas, reapply every 2 hours as needed.  Recommend staying in the shade or wearing long sleeves, sun glasses (UVA+UVB protection) and wide brim hats (4-inch brim around the entire circumference of the hat). Call for new or changing lesions. Destruction of lesion - left forehead x 1 Complexity: simple   Destruction method: cryotherapy   Informed consent: discussed and consent obtained   Timeout:  patient name, date of birth, surgical site, and procedure verified Lesion destroyed using liquid nitrogen: Yes   Region frozen until ice ball extended beyond lesion: Yes   Outcome:  patient tolerated procedure well with no complications   Post-procedure details: wound care instructions given    ABRASION   Related Medications doxycycline  (MONODOX ) 100 MG capsule Take 1 capsule (100 mg total) by mouth 2 (two) times daily for 7 days. Take with food and drink mupirocin  ointment (BACTROBAN ) 2 % Apply topically bid  to wound at scalp and left inner thigh until resolved NEOPLASM OF UNCERTAIN BEHAVIOR right lateral neck Epidermal / dermal shaving  Lesion diameter (cm):  0.8 Informed consent: discussed and consent obtained   Timeout: patient name, date of birth, surgical site, and procedure verified   Procedure prep:  Patient was prepped and draped in usual sterile fashion Prep type:  Isopropyl alcohol Anesthesia: the lesion was anesthetized in a standard fashion   Anesthetic:  1% lidocaine w/ epinephrine 1-100,000 buffered w/ 8.4% NaHCO3 Instrument used: flexible razor blade   Hemostasis achieved with: pressure, aluminum chloride and electrodesiccation   Outcome: patient tolerated procedure well   Post-procedure details: sterile dressing applied and wound care instructions given   Dressing type: bandage and petrolatum    Destruction of lesion Complexity: extensive   Destruction method: electrodesiccation and curettage   Informed consent: discussed and consent obtained   Timeout:  patient name, date of birth, surgical site, and procedure verified Procedure prep:  Patient was prepped and draped in usual sterile fashion Prep type:  Isopropyl alcohol Anesthesia: the lesion was anesthetized in a standard fashion   Anesthetic:  1% lidocaine w/ epinephrine 1-100,000 buffered w/ 8.4% NaHCO3 Curettage performed in three different directions: Yes   Electrodesiccation performed over the curetted area: Yes   Lesion length (cm):  0.8 Lesion width (cm):  0.8 Margin per side (cm):  0.2 Final wound size (cm):  1.2 Hemostasis achieved with:  pressure, aluminum chloride and electrodesiccation Outcome: patient tolerated procedure well with no complications   Post-procedure details: sterile dressing applied and wound care instructions given   Dressing type: bandage and petrolatum    Specimen 1 - Surgical pathology Differential Diagnosis: r/o bcc vs scc ED&C done  Check Margins: No R/o bcc vs scc ED&C done  today   Present for over 5 weeks INSECT BITE OF LEFT THIGH, INITIAL ENCOUNTER   SEBORRHEIC KERATOSIS   ACTINIC SKIN DAMAGE    Return for keep follow up as scheduled.  IEleanor Blush, CMA, am acting as scribe for Alm Rhyme, MD.   Documentation: I have reviewed the above documentation for accuracy and completeness, and I agree with the above.  Alm Rhyme, MD

## 2023-11-04 NOTE — Patient Instructions (Addendum)
 Electrodesiccation and Curettage ("Scrape and Burn") Wound Care Instructions  Leave the original bandage on for 24 hours if possible.  If the bandage becomes soaked or soiled before that time, it is OK to remove it and examine the wound.  A small amount of post-operative bleeding is normal.  If excessive bleeding occurs, remove the bandage, place gauze over the site and apply continuous pressure (no peeking) over the area for 30 minutes. If this does not work, please call our clinic as soon as possible or page your doctor if it is after hours.   Once a day, cleanse the wound with soap and water. It is fine to shower. If a thick crust develops you may use a Q-tip dipped into dilute hydrogen peroxide (mix 1:1 with water) to dissolve it.  Hydrogen peroxide can slow the healing process, so use it only as needed.    After washing, apply petroleum jelly (Vaseline) or an antibiotic ointment if your doctor prescribed one for you, followed by a bandage.    For best healing, the wound should be covered with a layer of ointment at all times. If you are not able to keep the area covered with a bandage to hold the ointment in place, this may mean re-applying the ointment several times a day.  Continue this wound care until the wound has healed and is no longer open. It may take several weeks for the wound to heal and close.  Itching and mild discomfort is normal during the healing process.  If you have any discomfort, you can take Tylenol  (acetaminophen ) or ibuprofen as directed on the bottle. (Please do not take these if you have an allergy to them or cannot take them for another reason).  Some redness, tenderness and white or yellow material in the wound is normal healing.  If the area becomes very sore and red, or develops a thick yellow-green material (pus), it may be infected; please notify us .    Wound healing continues for up to one year following surgery. It is not unusual to experience pain in the scar  from time to time during the interval.  If the pain becomes severe or the scar thickens, you should notify the office.    A slight amount of redness in a scar is expected for the first six months.  After six months, the redness will fade and the scar will soften and fade.  The color difference becomes less noticeable with time.  If there are any problems, return for a post-op surgery check at your earliest convenience.  To improve the appearance of the scar, you can use silicone scar gel, cream, or sheets (such as Mederma or Serica) every night for up to one year. These are available over the counter (without a prescription).  Please call our office at [phone] for any questions or concerns.     Biopsy Wound Care Instructions  Leave the original bandage on for 24 hours if possible.  If the bandage becomes soaked or soiled before that time, it is OK to remove it and examine the wound.  A small amount of post-operative bleeding is normal.  If excessive bleeding occurs, remove the bandage, place gauze over the site and apply continuous pressure (no peeking) over the area for 30 minutes. If this does not work, please call our clinic as soon as possible or page your doctor if it is after hours.   Once a day, cleanse the wound with soap and water. It is fine  to shower. If a thick crust develops you may use a Q-tip dipped into dilute hydrogen peroxide (mix 1:1 with water) to dissolve it.  Hydrogen peroxide can slow the healing process, so use it only as needed.    After washing, apply petroleum jelly (Vaseline) or an antibiotic ointment if your doctor prescribed one for you, followed by a bandage.    For best healing, the wound should be covered with a layer of ointment at all times. If you are not able to keep the area covered with a bandage to hold the ointment in place, this may mean re-applying the ointment several times a day.  Continue this wound care until the wound has healed and is no  longer open.   Itching and mild discomfort is normal during the healing process. However, if you develop pain or severe itching, please call our office.   If you have any discomfort, you can take Tylenol  (acetaminophen ) or ibuprofen as directed on the bottle. (Please do not take these if you have an allergy to them or cannot take them for another reason).  Some redness, tenderness and white or yellow material in the wound is normal healing.  If the area becomes very sore and red, or develops a thick yellow-green material (pus), it may be infected; please notify us .    If you have stitches, return to clinic as directed to have the stitches removed. You will continue wound care for 2-3 days after the stitches are removed.   Wound healing continues for up to one year following surgery. It is not unusual to experience pain in the scar from time to time during the interval.  If the pain becomes severe or the scar thickens, you should notify the office.    A slight amount of redness in a scar is expected for the first six months.  After six months, the redness will fade and the scar will soften and fade.  The color difference becomes less noticeable with time.  If there are any problems, return for a post-op surgery check at your earliest convenience.  To improve the appearance of the scar, you can use silicone scar gel, cream, or sheets (such as Mederma or Serica) every night for up to one year. These are available over the counter (without a prescription).  Please call our office at [phone] for any questions or concerns.     Doxycycline  should be taken with food to prevent nausea. Do not lay down for 30 minutes after taking. Be cautious with sun exposure and use good sun protection while on this medication. Pregnant women should not take this medication.      Actinic keratoses are precancerous spots that appear secondary to cumulative UV radiation exposure/sun exposure over time. They are  chronic with expected duration over 1 year. A portion of actinic keratoses will progress to squamous cell carcinoma of the skin. It is not possible to reliably predict which spots will progress to skin cancer and so treatment is recommended to prevent development of skin cancer.  Recommend daily broad spectrum sunscreen SPF 30+ to sun-exposed areas, reapply every 2 hours as needed.  Recommend staying in the shade or wearing long sleeves, sun glasses (UVA+UVB protection) and wide brim hats (4-inch brim around the entire circumference of the hat). Call for new or changing lesions.    Cryotherapy Aftercare  Wash gently with soap and water everyday.   Apply Vaseline and Band-Aid daily until healed.    Due to recent changes  in healthcare laws, you may see results of your pathology and/or laboratory studies on MyChart before the doctors have had a chance to review them. We understand that in some cases there may be results that are confusing or concerning to you. Please understand that not all results are received at the same time and often the doctors may need to interpret multiple results in order to provide you with the best plan of care or course of treatment. Therefore, we ask that you please give us  2 business days to thoroughly review all your results before contacting the office for clarification. Should we see a critical lab result, you will be contacted sooner.   If You Need Anything After Your Visit  If you have any questions or concerns for your doctor, please call our main line at [phone] and press option 4 to reach your doctor's medical assistant. If no one answers, please leave a voicemail as directed and we will return your call as soon as possible. Messages left after 4 pm will be answered the following business day.   You may also send us  a message via MyChart. We typically respond to MyChart messages within 1-2 business days.  For prescription refills, please ask your pharmacy  to contact our office. Our fax number is [phone].  If you have an urgent issue when the clinic is closed that cannot wait until the next business day, you can page your doctor at the number below.    Please note that while we do our best to be available for urgent issues outside of office hours, we are not available 24/7.   If you have an urgent issue and are unable to reach us , you may choose to seek medical care at your doctor's office, retail clinic, urgent care center, or emergency room.  If you have a medical emergency, please immediately call 911 or go to the emergency department.  Pager Numbers  - Dr. Hester: [phone]  - Dr. Jackquline: [phone]  - Dr. Claudene: [phone]   In the event of inclement weather, please call our main line at [phone] for an update on the status of any delays or closures.  Dermatology Medication Tips: Please keep the boxes that topical medications come in in order to help keep track of the instructions about where and how to use these. Pharmacies typically print the medication instructions only on the boxes and not directly on the medication tubes.   If your medication is too expensive, please contact our office at [phone] option 4 or send us  a message through MyChart.   We are unable to tell what your co-pay for medications will be in advance as this is different depending on your insurance coverage. However, we may be able to find a substitute medication at lower cost or fill out paperwork to get insurance to cover a needed medication.   If a prior authorization is required to get your medication covered by your insurance company, please allow us  1-2 business days to complete this process.  Drug prices often vary depending on where the prescription is filled and some pharmacies may offer cheaper prices.  The website www.goodrx.com contains coupons for medications through different pharmacies. The prices here do not account for  what the cost may be with help from insurance (it may be cheaper with your insurance), but the website can give you the price if you did not use any insurance.  - You can print the associated coupon and take it with your prescription  to the pharmacy.  - You may also stop by our office during regular business hours and pick up a GoodRx coupon card.  - If you need your prescription sent electronically to a different pharmacy, notify our office through [facility] or by phone at [phone] option 4.     Si Usted Necesita Algo Despus de Su Visita  Tambin puede enviarnos un mensaje a travs de Clinical cytogeneticist. Por lo general respondemos a los mensajes de MyChart en el transcurso de 1 a 2 das hbiles.  Para renovar recetas, por favor pida a su farmacia que se ponga en contacto con nuestra oficina. Randi lakes de fax es [place] [phone].  Si tiene un asunto urgente cuando la clnica est cerrada y que no puede esperar hasta el siguiente da hbil, puede llamar/localizar a su doctor(a) al nmero que aparece a continuacin.   Por favor, tenga en cuenta que aunque hacemos todo lo posible para estar disponibles para asuntos urgentes fuera del horario de [place], no estamos disponibles las 24 horas del da, los 7 [address]  [address] de la [place].   Si tiene un problema urgente y no puede comunicarse con nosotros, puede optar por buscar atencin mdica  en el consultorio de su doctor(a), en una clnica privada, en un centro de atencin urgente o en una sala de emergencias.  Si tiene Engineer, drilling, por favor llame inmediatamente al 911 o vaya a la sala de emergencias.  Nmeros de bper  - Dr. Hester: [phone]  - Dra. Jackquline: [phone]  - Dr. Claudene: [phone]   En caso de inclemencias del tiempo, por favor llame a landry capes principal al [phone] para una actualizacin sobre el [place] de cualquier retraso o cierre.  Consejos para la medicacin en dermatologa: Por favor, guarde  las cajas en las que vienen los medicamentos de uso tpico para ayudarle a seguir las instrucciones sobre dnde y cmo usarlos. Las farmacias generalmente imprimen las instrucciones del medicamento slo en las cajas y no directamente en los tubos del [place].   Si su medicamento es muy caro, por favor, pngase en contacto con landry rieger llamando al [phone] y presione la opcin 4 o envenos un mensaje a travs de Clinical cytogeneticist.   No podemos decirle cul ser su copago por los medicamentos por adelantado ya que esto es diferente dependiendo de la cobertura de su seguro. Sin embargo, es posible que podamos encontrar un medicamento sustituto a Audiological scientist un formulario para que el seguro cubra el medicamento que se considera necesario.   Si se requiere una autorizacin previa para que su compaa de seguros malta su medicamento, por favor permtanos de 1 a 2 das hbiles para completar este proceso.  Los precios de los medicamentos varan con frecuencia dependiendo del Environmental consultant de dnde se surte la receta y alguna farmacias pueden ofrecer precios ms baratos.  El sitio web www.goodrx.com tiene cupones para medicamentos de Health and safety inspector. Los precios aqu no tienen en cuenta lo que podra costar con la ayuda del seguro (puede ser ms barato con su seguro), pero el sitio web puede darle el precio si no utiliz Tourist information centre manager.  - Puede imprimir el cupn correspondiente y llevarlo con su receta a la farmacia.  - Tambin puede pasar por nuestra oficina durante el horario de atencin regular y Education officer, museum una tarjeta de cupones de GoodRx.  - Si necesita que su receta se enve electrnicamente a Psychiatrist, informe a nuestra oficina a travs de MyChart de [employer] o por  telfono llamando al [phone] y presione la opcin 4.

## 2023-11-10 ENCOUNTER — Ambulatory Visit: Payer: Self-pay | Admitting: Dermatology

## 2023-11-10 LAB — SURGICAL PATHOLOGY

## 2023-11-11 ENCOUNTER — Encounter: Payer: Self-pay | Admitting: Dermatology

## 2023-11-11 NOTE — Telephone Encounter (Addendum)
 Called and discussed results with patient. Verbalized understanding and denied questions. Will recheck at next followup.    ----- Message from Alm Rhyme sent at 11/10/2023  7:03 PM EDT ----- FINAL DIAGNOSIS        1. Skin, right lateral neck :       BASAL CELL CARCINOMA, NODULAR PATTERN   Cancer = BCC Already treated Recheck next visit ----- Message ----- From: Interface, Lab In Three Zero One Sent: 11/10/2023   6:54 PM EDT To: Alm JAYSON Rhyme, MD

## 2023-12-21 DIAGNOSIS — R739 Hyperglycemia, unspecified: Secondary | ICD-10-CM | POA: Diagnosis not present

## 2023-12-21 DIAGNOSIS — E782 Mixed hyperlipidemia: Secondary | ICD-10-CM | POA: Diagnosis not present

## 2023-12-21 DIAGNOSIS — R972 Elevated prostate specific antigen [PSA]: Secondary | ICD-10-CM | POA: Diagnosis not present

## 2023-12-23 ENCOUNTER — Ambulatory Visit: Payer: PPO | Admitting: Dermatology

## 2023-12-28 DIAGNOSIS — Z125 Encounter for screening for malignant neoplasm of prostate: Secondary | ICD-10-CM | POA: Diagnosis not present

## 2023-12-28 DIAGNOSIS — I7121 Aneurysm of the ascending aorta, without rupture: Secondary | ICD-10-CM | POA: Diagnosis not present

## 2023-12-28 DIAGNOSIS — Z79899 Other long term (current) drug therapy: Secondary | ICD-10-CM | POA: Diagnosis not present

## 2023-12-28 DIAGNOSIS — R739 Hyperglycemia, unspecified: Secondary | ICD-10-CM | POA: Diagnosis not present

## 2024-01-04 DIAGNOSIS — R972 Elevated prostate specific antigen [PSA]: Secondary | ICD-10-CM | POA: Diagnosis not present

## 2024-01-04 DIAGNOSIS — N400 Enlarged prostate without lower urinary tract symptoms: Secondary | ICD-10-CM | POA: Diagnosis not present

## 2024-01-11 ENCOUNTER — Ambulatory Visit: Admitting: Dermatology

## 2024-01-11 DIAGNOSIS — Z85828 Personal history of other malignant neoplasm of skin: Secondary | ICD-10-CM | POA: Diagnosis not present

## 2024-01-11 DIAGNOSIS — L578 Other skin changes due to chronic exposure to nonionizing radiation: Secondary | ICD-10-CM | POA: Diagnosis not present

## 2024-01-11 DIAGNOSIS — L821 Other seborrheic keratosis: Secondary | ICD-10-CM | POA: Diagnosis not present

## 2024-01-11 DIAGNOSIS — L570 Actinic keratosis: Secondary | ICD-10-CM | POA: Diagnosis not present

## 2024-01-11 DIAGNOSIS — L814 Other melanin hyperpigmentation: Secondary | ICD-10-CM

## 2024-01-11 DIAGNOSIS — L820 Inflamed seborrheic keratosis: Secondary | ICD-10-CM | POA: Diagnosis not present

## 2024-01-11 DIAGNOSIS — D1801 Hemangioma of skin and subcutaneous tissue: Secondary | ICD-10-CM | POA: Diagnosis not present

## 2024-01-11 DIAGNOSIS — W908XXA Exposure to other nonionizing radiation, initial encounter: Secondary | ICD-10-CM

## 2024-01-11 DIAGNOSIS — L905 Scar conditions and fibrosis of skin: Secondary | ICD-10-CM | POA: Diagnosis not present

## 2024-01-11 DIAGNOSIS — Z1283 Encounter for screening for malignant neoplasm of skin: Secondary | ICD-10-CM | POA: Diagnosis not present

## 2024-01-11 DIAGNOSIS — I8390 Asymptomatic varicose veins of unspecified lower extremity: Secondary | ICD-10-CM | POA: Diagnosis not present

## 2024-01-11 DIAGNOSIS — D229 Melanocytic nevi, unspecified: Secondary | ICD-10-CM

## 2024-01-11 NOTE — Patient Instructions (Addendum)
 Cryotherapy Aftercare  Wash gently with soap and water everyday.   Apply Vaseline and Band-Aid daily until healed.     Actinic keratoses are precancerous spots that appear secondary to cumulative UV radiation exposure/sun exposure over time. They are chronic with expected duration over 1 year. A portion of actinic keratoses will progress to squamous cell carcinoma of the skin. It is not possible to reliably predict which spots will progress to skin cancer and so treatment is recommended to prevent development of skin cancer.  Recommend daily broad spectrum sunscreen SPF 30+ to sun-exposed areas, reapply every 2 hours as needed.  Recommend staying in the shade or wearing long sleeves, sun glasses (UVA+UVB protection) and wide brim hats (4-inch brim around the entire circumference of the hat). Call for new or changing lesions.     Seborrheic Keratosis  What causes seborrheic keratoses? Seborrheic keratoses are harmless, common skin growths that first appear during adult life.  As time goes by, more growths appear.  Some people may develop a large number of them.  Seborrheic keratoses appear on both covered and uncovered body parts.  They are not caused by sunlight.  The tendency to develop seborrheic keratoses can be inherited.  They vary in color from skin-colored to gray, brown, or even black.  They can be either smooth or have a rough, warty surface.   Seborrheic keratoses are superficial and look as if they were stuck on the skin.  Under the microscope this type of keratosis looks like layers upon layers of skin.  That is why at times the top layer may seem to fall off, but the rest of the growth remains and re-grows.    Treatment Seborrheic keratoses do not need to be treated, but can easily be removed in the office.  Seborrheic keratoses often cause symptoms when they rub on clothing or jewelry.  Lesions can be in the way of shaving.  If they become inflamed, they can cause itching,  soreness, or burning.  Removal of a seborrheic keratosis can be accomplished by freezing, burning, or surgery. If any spot bleeds, scabs, or grows rapidly, please return to have it checked, as these can be an indication of a skin cancer.   Melanoma ABCDEs  Melanoma is the most dangerous type of skin cancer, and is the leading cause of death from skin disease.  You are more likely to develop melanoma if you: Have light-colored skin, light-colored eyes, or red or blond hair Spend a lot of time in the sun Tan regularly, either outdoors or in a tanning bed Have had blistering sunburns, especially during childhood Have a close family member who has had a melanoma Have atypical moles or large birthmarks  Early detection of melanoma is key since treatment is typically straightforward and cure rates are extremely high if we catch it early.   The first sign of melanoma is often a change in a mole or a new dark spot.  The ABCDE system is a way of remembering the signs of melanoma.  A for asymmetry:  The two halves do not match. B for border:  The edges of the growth are irregular. C for color:  A mixture of colors are present instead of an even brown color. D for diameter:  Melanomas are usually (but not always) greater than 6mm - the size of a pencil eraser. E for evolution:  The spot keeps changing in size, shape, and color.  Please check your skin once per month between visits. You  can use a small mirror in front and a large mirror behind you to keep an eye on the back side or your body.   If you see any new or changing lesions before your next follow-up, please call to schedule a visit.  Please continue daily skin protection including broad spectrum sunscreen SPF 30+ to sun-exposed areas, reapplying every 2 hours as needed when you're outdoors.   Staying in the shade or wearing long sleeves, sun glasses (UVA+UVB protection) and wide brim hats (4-inch brim around the entire circumference of  the hat) are also recommended for sun protection.     Due to recent changes in healthcare laws, you may see results of your pathology and/or laboratory studies on MyChart before the doctors have had a chance to review them. We understand that in some cases there may be results that are confusing or concerning to you. Please understand that not all results are received at the same time and often the doctors may need to interpret multiple results in order to provide you with the best plan of care or course of treatment. Therefore, we ask that you please give us  2 business days to thoroughly review all your results before contacting the office for clarification. Should we see a critical lab result, you will be contacted sooner.   If You Need Anything After Your Visit  If you have any questions or concerns for your doctor, please call our main line at [phone] and press option 4 to reach your doctor's medical assistant. If no one answers, please leave a voicemail as directed and we will return your call as soon as possible. Messages left after 4 pm will be answered the following business day.   You may also send us  a message via MyChart. We typically respond to MyChart messages within 1-2 business days.  For prescription refills, please ask your pharmacy to contact our office. Our fax number is [phone].  If you have an urgent issue when the clinic is closed that cannot wait until the next business day, you can page your doctor at the number below.    Please note that while we do our best to be available for urgent issues outside of office hours, we are not available 24/7.   If you have an urgent issue and are unable to reach us , you may choose to seek medical care at your doctor's office, retail clinic, urgent care center, or emergency room.  If you have a medical emergency, please immediately call 911 or go to the emergency department.  Pager Numbers  - Dr. Hester: [phone]  -  Dr. Jackquline: [phone]  - Dr. Claudene: [phone]   - Dr. Raymund: [phone]  In the event of inclement weather, please call our main line at [phone] for an update on the status of any delays or closures.  Dermatology Medication Tips: Please keep the boxes that topical medications come in in order to help keep track of the instructions about where and how to use these. Pharmacies typically print the medication instructions only on the boxes and not directly on the medication tubes.   If your medication is too expensive, please contact our office at [phone] option 4 or send us  a message through MyChart.   We are unable to tell what your co-pay for medications will be in advance as this is different depending on your insurance coverage. However, we may be able to find a substitute medication at lower cost or fill out paperwork to get insurance  to cover a needed medication.   If a prior authorization is required to get your medication covered by your insurance company, please allow us  1-2 business days to complete this process.  Drug prices often vary depending on where the prescription is filled and some pharmacies may offer cheaper prices.  The website www.goodrx.com contains coupons for medications through different pharmacies. The prices here do not account for what the cost may be with help from insurance (it may be cheaper with your insurance), but the website can give you the price if you did not use any insurance.  - You can print the associated coupon and take it with your prescription to the pharmacy.  - You may also stop by our office during regular business hours and pick up a GoodRx coupon card.  - If you need your prescription sent electronically to a different pharmacy, notify our office through [facility] or by phone at [phone] option 4.     Si Usted Necesita Algo Despus de Su Visita  Tambin puede enviarnos un mensaje a travs de Clinical cytogeneticist. Por lo  general respondemos a los mensajes de MyChart en el transcurso de 1 a 2 das hbiles.  Para renovar recetas, por favor pida a su farmacia que se ponga en contacto con nuestra oficina. Randi lakes de fax es [place] [phone].  Si tiene un asunto urgente cuando la clnica est cerrada y que no puede esperar hasta el siguiente da hbil, puede llamar/localizar a su doctor(a) al nmero que aparece a continuacin.   Por favor, tenga en cuenta que aunque hacemos todo lo posible para estar disponibles para asuntos urgentes fuera del horario de [place], no estamos disponibles las 24 horas del da, los 7 [address]  [address] de la [place].   Si tiene un problema urgente y no puede comunicarse con nosotros, puede optar por buscar atencin mdica  en el consultorio de su doctor(a), en una clnica privada, en un centro de atencin urgente o en una sala de emergencias.  Si tiene Engineer, drilling, por favor llame inmediatamente al 911 o vaya a la sala de emergencias.  Nmeros de bper  - Dr. Hester: [phone]  - Dra. Jackquline: [phone]  - Dr. Claudene: [phone]  - Dra. Kitts: [phone]  En caso de inclemencias del [place], por favor llame a nuestra lnea principal al [phone] para una actualizacin sobre el estado de cualquier retraso o cierre.  Consejos para la medicacin en dermatologa: Por favor, guarde las cajas en las que vienen los medicamentos de uso tpico para ayudarle a seguir las instrucciones sobre dnde y cmo usarlos. Las farmacias generalmente imprimen las instrucciones del medicamento slo en las cajas y no directamente en los tubos del [place].   Si su medicamento es muy caro, por favor, pngase en contacto con landry rieger llamando al [phone] y presione la opcin 4 o envenos un mensaje a travs de Clinical cytogeneticist.   No podemos decirle cul ser su copago por los medicamentos por adelantado ya que esto es diferente dependiendo de la cobertura de su seguro. Sin embargo, es  posible que podamos encontrar un medicamento sustituto a Audiological scientist un formulario para que el seguro cubra el medicamento que se considera necesario.   Si se requiere una autorizacin previa para que su compaa de seguros malta su medicamento, por favor permtanos de 1 a 2 das hbiles para completar este proceso.  Los precios de los medicamentos varan con frecuencia dependiendo del Environmental consultant de dnde se surte la receta y  alguna farmacias pueden ofrecer precios ms baratos.  El sitio web www.goodrx.com tiene cupones para medicamentos de Health and safety inspector. Los precios aqu no tienen en cuenta lo que podra costar con la ayuda del seguro (puede ser ms barato con su seguro), pero el sitio web puede darle el precio si no utiliz Tourist information centre manager.  - Puede imprimir el cupn correspondiente y llevarlo con su receta a la farmacia.  - Tambin puede pasar por nuestra oficina durante el horario de atencin regular y Education officer, museum una tarjeta de cupones de GoodRx.  - Si necesita que su receta se enve electrnicamente a una farmacia diferente, informe a nuestra oficina a travs de MyChart de [place] o por telfono llamando al [phone] y presione la opcin 4.

## 2024-01-11 NOTE — Progress Notes (Unsigned)
 Follow-Up Visit   Subjective  Kevin Carpenter is a 68 y.o. male who presents for the following: Skin Cancer Screening and Full Body Skin Exam Hx of bcc, hx of aks,  The patient presents for Total-Body Skin Exam (TBSE) for skin cancer screening and mole check. The patient has spots, moles and lesions to be evaluated, some may be new or changing and the patient may have concern these could be cancer.  The following portions of the chart were reviewed this encounter and updated as appropriate: medications, allergies, medical history  Review of Systems:  No other skin or systemic complaints except as noted in HPI or Assessment and Plan.  Objective  Well appearing patient in no apparent distress; mood and affect are within normal limits.  A full examination was performed including scalp, head, eyes, ears, nose, lips, neck, chest, axillae, abdomen, back, buttocks, bilateral upper extremities, bilateral lower extremities, hands, feet, fingers, toes, fingernails, and toenails. All findings within normal limits unless otherwise noted below.   Relevant physical exam findings are noted in the Assessment and Plan.           left forehead x 1 Erythematous stuck-on, waxy papule or plaque  scalp x 3 (3) Erythematous thin papules/macules with gritty scale.   Assessment & Plan   HISTORY OF BASAL CELL CARCINOMA OF THE SKIN 11/03/2023 right lateral neck - ED&C 10/18/2014 - right nasal alar rim  - No evidence of recurrence today - Recommend regular full body skin exams - Recommend daily broad spectrum sunscreen SPF 30+ to sun-exposed areas, reapply every 2 hours as needed.  - Call if any new or changing lesions are noted between office visits   SKIN CANCER SCREENING PERFORMED TODAY.  ACTINIC DAMAGE - Chronic condition, secondary to cumulative UV/sun exposure - diffuse scaly erythematous macules with underlying dyspigmentation - Recommend daily broad spectrum sunscreen SPF 30+ to sun-exposed  areas, reapply every 2 hours as needed.  - Staying in the shade or wearing long sleeves, sun glasses (UVA+UVB protection) and wide brim hats (4-inch brim around the entire circumference of the hat) are also recommended for sun protection.  - Call for new or changing lesions.  LENTIGINES, SEBORRHEIC KERATOSES, HEMANGIOMAS - Benign normal skin lesions - Benign-appearing - Call for any changes  MELANOCYTIC NEVI - Tan-brown and/or pink-flesh-colored symmetric macules and papules - Benign appearing on exam today - Observation - Call clinic for new or changing moles - Recommend daily use of broad spectrum spf 30+ sunscreen to sun-exposed areas.   Varicose Veins/Spider Veins - Dilated blue, purple or red veins at the lower extremities - Reassured - Smaller vessels can be treated by sclerotherapy (a procedure to inject a medicine into the veins to make them disappear) if desired, but the treatment is not covered by insurance. Larger vessels may be covered if symptomatic and we would refer to vascular surgeon if treatment desired.   Scar / INDENTATION at LEFT FOREHEAD  See photo Pt reports this is site of AK treatment with LN2 last visit Exam: see photos  Treatment Plan: Benign-appearing.  Observation.  Call clinic for new or changing lesions.  Recommend daily use of broad spectrum spf 30+ sunscreen to sun-exposed areas.    INFLAMED SEBORRHEIC KERATOSIS left forehead x 1 Symptomatic, irritating, patient would like treated. Destruction of lesion - left forehead x 1 Complexity: simple   Destruction method: cryotherapy   Informed consent: discussed and consent obtained   Timeout:  patient name, date of birth, surgical site,  and procedure verified Lesion destroyed using liquid nitrogen: Yes   Region frozen until ice ball extended beyond lesion: Yes   Outcome: patient tolerated procedure well with no complications   Post-procedure details: wound care instructions given    ACTINIC  KERATOSIS (3) scalp x 3 (3) Actinic keratoses are precancerous spots that appear secondary to cumulative UV radiation exposure/sun exposure over time. They are chronic with expected duration over 1 year. A portion of actinic keratoses will progress to squamous cell carcinoma of the skin. It is not possible to reliably predict which spots will progress to skin cancer and so treatment is recommended to prevent development of skin cancer.  Recommend daily broad spectrum sunscreen SPF 30+ to sun-exposed areas, reapply every 2 hours as needed.  Recommend staying in the shade or wearing long sleeves, sun glasses (UVA+UVB protection) and wide brim hats (4-inch brim around the entire circumference of the hat). Call for new or changing lesions. Destruction of lesion - scalp x 3 (3) Complexity: simple   Destruction method: cryotherapy   Informed consent: discussed and consent obtained   Timeout:  patient name, date of birth, surgical site, and procedure verified Lesion destroyed using liquid nitrogen: Yes   Region frozen until ice ball extended beyond lesion: Yes   Outcome: patient tolerated procedure well with no complications   Post-procedure details: wound care instructions given    Return for 6 - 8 month tbse .  IEleanor Blush, CMA, am acting as scribe for Alm Rhyme, MD.   Documentation: I have reviewed the above documentation for accuracy and completeness, and I agree with the above.  Alm Rhyme, MD

## 2024-01-12 ENCOUNTER — Encounter: Payer: Self-pay | Admitting: Dermatology

## 2024-01-15 ENCOUNTER — Other Ambulatory Visit: Payer: Self-pay | Admitting: Dermatology

## 2024-01-15 DIAGNOSIS — T148XXA Other injury of unspecified body region, initial encounter: Secondary | ICD-10-CM

## 2024-02-04 DIAGNOSIS — I7121 Aneurysm of the ascending aorta, without rupture: Secondary | ICD-10-CM | POA: Diagnosis not present

## 2024-02-04 DIAGNOSIS — E785 Hyperlipidemia, unspecified: Secondary | ICD-10-CM | POA: Diagnosis not present

## 2024-02-04 DIAGNOSIS — I351 Nonrheumatic aortic (valve) insufficiency: Secondary | ICD-10-CM | POA: Diagnosis not present

## 2024-02-04 DIAGNOSIS — Z7901 Long term (current) use of anticoagulants: Secondary | ICD-10-CM | POA: Diagnosis not present

## 2024-02-04 DIAGNOSIS — F17220 Nicotine dependence, chewing tobacco, uncomplicated: Secondary | ICD-10-CM | POA: Diagnosis not present

## 2024-02-04 DIAGNOSIS — I517 Cardiomegaly: Secondary | ICD-10-CM | POA: Diagnosis not present

## 2024-02-04 DIAGNOSIS — Z86711 Personal history of pulmonary embolism: Secondary | ICD-10-CM | POA: Diagnosis not present

## 2024-05-24 ENCOUNTER — Other Ambulatory Visit

## 2024-08-04 ENCOUNTER — Ambulatory Visit: Admitting: Dermatology
